# Patient Record
Sex: Male | Born: 1940
Health system: Southern US, Community
[De-identification: ages and names within clinical notes are randomized; demographics above are authoritative.]

## PROBLEM LIST (undated history)

## (undated) DIAGNOSIS — R519 Headache, unspecified: Secondary | ICD-10-CM

## (undated) DIAGNOSIS — I639 Cerebral infarction, unspecified: Secondary | ICD-10-CM

## (undated) DIAGNOSIS — M1990 Unspecified osteoarthritis, unspecified site: Secondary | ICD-10-CM

## (undated) DIAGNOSIS — J449 Chronic obstructive pulmonary disease, unspecified: Secondary | ICD-10-CM

## (undated) DIAGNOSIS — R51 Headache: Secondary | ICD-10-CM

## (undated) DIAGNOSIS — R251 Tremor, unspecified: Secondary | ICD-10-CM

## (undated) DIAGNOSIS — W3400XA Accidental discharge from unspecified firearms or gun, initial encounter: Secondary | ICD-10-CM

## (undated) DIAGNOSIS — N289 Disorder of kidney and ureter, unspecified: Secondary | ICD-10-CM

## (undated) HISTORY — PX: EYE SURGERY: SHX253

## (undated) HISTORY — PX: BRAIN SURGERY: SHX531

## (undated) HISTORY — PX: SHOULDER SURGERY: SHX246

## (undated) HISTORY — PX: BACK SURGERY: SHX140

## (undated) SURGERY — Surgical Case
Anesthesia: *Unknown

---

## 2015-07-14 ENCOUNTER — Emergency Department (HOSPITAL_BASED_OUTPATIENT_CLINIC_OR_DEPARTMENT_OTHER)
Admission: EM | Admit: 2015-07-14 | Discharge: 2015-07-14 | Disposition: A | Discharge status: Home / Self Care | Payer: Medicare Other | Attending: Emergency Medicine | Admitting: Emergency Medicine

## 2015-07-14 ENCOUNTER — Other Ambulatory Visit: Payer: Self-pay

## 2015-07-14 ENCOUNTER — Encounter (HOSPITAL_BASED_OUTPATIENT_CLINIC_OR_DEPARTMENT_OTHER): Payer: Self-pay | Admitting: *Deleted

## 2015-07-14 ENCOUNTER — Emergency Department (HOSPITAL_BASED_OUTPATIENT_CLINIC_OR_DEPARTMENT_OTHER): Payer: Medicare Other

## 2015-07-14 DIAGNOSIS — J441 Chronic obstructive pulmonary disease with (acute) exacerbation: Secondary | ICD-10-CM | POA: Insufficient documentation

## 2015-07-14 DIAGNOSIS — Z87828 Personal history of other (healed) physical injury and trauma: Secondary | ICD-10-CM | POA: Insufficient documentation

## 2015-07-14 DIAGNOSIS — R911 Solitary pulmonary nodule: Secondary | ICD-10-CM | POA: Diagnosis not present

## 2015-07-14 DIAGNOSIS — Z79899 Other long term (current) drug therapy: Secondary | ICD-10-CM | POA: Diagnosis not present

## 2015-07-14 DIAGNOSIS — M6281 Muscle weakness (generalized): Secondary | ICD-10-CM | POA: Diagnosis not present

## 2015-07-14 DIAGNOSIS — Z87448 Personal history of other diseases of urinary system: Secondary | ICD-10-CM | POA: Insufficient documentation

## 2015-07-14 DIAGNOSIS — R0602 Shortness of breath: Secondary | ICD-10-CM | POA: Diagnosis present

## 2015-07-14 DIAGNOSIS — F17210 Nicotine dependence, cigarettes, uncomplicated: Secondary | ICD-10-CM | POA: Insufficient documentation

## 2015-07-14 HISTORY — DX: Disorder of kidney and ureter, unspecified: N28.9

## 2015-07-14 HISTORY — DX: Chronic obstructive pulmonary disease, unspecified: J44.9

## 2015-07-14 HISTORY — DX: Accidental discharge from unspecified firearms or gun, initial encounter: W34.00XA

## 2015-07-14 LAB — CBC
HCT: 46.7 % (ref 39.0–52.0)
HEMOGLOBIN: 16 g/dL (ref 13.0–17.0)
MCH: 31.6 pg (ref 26.0–34.0)
MCHC: 34.3 g/dL (ref 30.0–36.0)
MCV: 92.3 fL (ref 78.0–100.0)
Platelets: 213 10*3/uL (ref 150–400)
RBC: 5.06 MIL/uL (ref 4.22–5.81)
RDW: 13 % (ref 11.5–15.5)
WBC: 4.8 10*3/uL (ref 4.0–10.5)

## 2015-07-14 LAB — BASIC METABOLIC PANEL
ANION GAP: 7 (ref 5–15)
BUN: 12 mg/dL (ref 6–20)
CALCIUM: 8.9 mg/dL (ref 8.9–10.3)
CHLORIDE: 104 mmol/L (ref 101–111)
CO2: 26 mmol/L (ref 22–32)
Creatinine, Ser: 1.07 mg/dL (ref 0.61–1.24)
GFR calc non Af Amer: >60 mL/min (ref >60)
Glucose, Bld: 99 mg/dL (ref 65–99)
Potassium: 4.2 mmol/L (ref 3.5–5.1)
Sodium: 137 mmol/L (ref 135–145)

## 2015-07-14 LAB — TROPONIN I: Troponin I: <0.03 ng/mL (ref <0.031)

## 2015-07-14 LAB — D-DIMER, QUANTITATIVE: D-Dimer, Quant: 0.95 ug/mL-FEU — ABNORMAL HIGH (ref 0.00–0.50)

## 2015-07-14 MED ORDER — ALBUTEROL SULFATE HFA 108 (90 BASE) MCG/ACT IN AERS: 2.0000 | INHALATION_SPRAY | Freq: Four times a day (QID) | RESPIRATORY_TRACT | Status: DC | PRN

## 2015-07-14 MED ORDER — MAGNESIUM SULFATE 2 GM/50ML IV SOLN
2.0000 g | Freq: Once | INTRAVENOUS | Status: AC
  Administered 2015-07-14: 2 g via INTRAVENOUS
  Filled 2015-07-14: qty 50

## 2015-07-14 MED ORDER — AZITHROMYCIN 250 MG PO TABS: 250.0000 mg | ORAL_TABLET | Freq: Every day | ORAL | Status: DC

## 2015-07-14 MED ORDER — IOHEXOL 350 MG/ML SOLN
100.0000 mL | Freq: Once | INTRAVENOUS | Status: AC | PRN
  Administered 2015-07-14: 100 mL via INTRAVENOUS

## 2015-07-14 MED ORDER — METHYLPREDNISOLONE SODIUM SUCC 125 MG IJ SOLR
125.0000 mg | Freq: Once | INTRAMUSCULAR | Status: AC
  Administered 2015-07-14: 125 mg via INTRAVENOUS
  Filled 2015-07-14: qty 2

## 2015-07-14 MED ORDER — SODIUM CHLORIDE 0.9 % IV BOLUS (SEPSIS)
1000.0000 mL | Freq: Once | INTRAVENOUS | Status: AC
  Administered 2015-07-14: 1000 mL via INTRAVENOUS

## 2015-07-14 MED ORDER — ALBUTEROL SULFATE HFA 108 (90 BASE) MCG/ACT IN AERS
2.0000 | INHALATION_SPRAY | Freq: Once | RESPIRATORY_TRACT | Status: AC
  Administered 2015-07-14: 2 via RESPIRATORY_TRACT
  Filled 2015-07-14: qty 6.7

## 2015-07-14 MED ORDER — OXYCODONE HCL 5 MG PO TABS: 5.0000 mg | ORAL_TABLET | ORAL | Status: DC | PRN

## 2015-07-14 MED ORDER — ALBUTEROL (5 MG/ML) CONTINUOUS INHALATION SOLN
10.0000 mg/h | INHALATION_SOLUTION | Freq: Once | RESPIRATORY_TRACT | Status: AC
  Administered 2015-07-14: 10 mg/h via RESPIRATORY_TRACT
  Filled 2015-07-14: qty 20

## 2015-07-14 MED ORDER — MORPHINE SULFATE (PF) 4 MG/ML IV SOLN
4.0000 mg | Freq: Once | INTRAVENOUS | Status: AC
  Administered 2015-07-14: 4 mg via INTRAVENOUS
  Filled 2015-07-14: qty 1

## 2015-07-14 MED ORDER — IPRATROPIUM BROMIDE 0.02 % IN SOLN
0.5000 mg | Freq: Once | RESPIRATORY_TRACT | Status: AC
  Administered 2015-07-14: 0.5 mg via RESPIRATORY_TRACT
  Filled 2015-07-14: qty 2.5

## 2015-07-14 MED ORDER — PREDNISONE 20 MG PO TABS: ORAL_TABLET | ORAL | Status: DC

## 2015-07-14 MED FILL — PROAIR HFA 90 MCG INHALER: 108 (90 BAS | 30 days supply | Qty: 9 | Fill #0

## 2015-07-14 MED FILL — oxyCODONE HCL 5 MG TABS: 5 | 2 days supply | Qty: 10 | Fill #0

## 2015-07-14 MED FILL — predniSONE 20 MG TABS: 20 | 5 days supply | Qty: 10 | Fill #0

## 2015-07-14 MED FILL — AZITHROMYCIN 250 MG TABLET: 250 | 4 days supply | Qty: 4 | Fill #0

## 2015-07-14 NOTE — Discharge Instructions (Signed)
CT FINDINGS: 4 mm nodule seen in right upper lobe. No follow-up needed if patient is low-risk. Non-contrast chest CT can be considered in 12 months if patient is high-risk. This recommendation follows the consensus statement: Guidelines for Management of Incidental Pulmonary Nodules Detected on CT Images:From the Fleischner Society 2017; published online before print (10.1148/[id number]).  - You need to follow up with your doctor to schedule  CT scan because your smoking history makes you high risk
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
## 2015-07-14 NOTE — ED Notes (Signed)
Cough x 2 days. Sob. He is speaking in complete sentences. Weakness in his legs. States he had back surgery 4 months ago.
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
## 2015-07-20 ENCOUNTER — Encounter (HOSPITAL_BASED_OUTPATIENT_CLINIC_OR_DEPARTMENT_OTHER): Payer: Self-pay | Admitting: *Deleted

## 2015-07-20 ENCOUNTER — Emergency Department (HOSPITAL_BASED_OUTPATIENT_CLINIC_OR_DEPARTMENT_OTHER)
Admission: EM | Admit: 2015-07-20 | Discharge: 2015-07-20 | Disposition: A | Discharge status: Home / Self Care | Payer: Medicare Other | Attending: Emergency Medicine | Admitting: Emergency Medicine

## 2015-07-20 DIAGNOSIS — Z87891 Personal history of nicotine dependence: Secondary | ICD-10-CM | POA: Diagnosis not present

## 2015-07-20 DIAGNOSIS — Z79899 Other long term (current) drug therapy: Secondary | ICD-10-CM | POA: Insufficient documentation

## 2015-07-20 DIAGNOSIS — Z87448 Personal history of other diseases of urinary system: Secondary | ICD-10-CM | POA: Diagnosis not present

## 2015-07-20 DIAGNOSIS — Z87828 Personal history of other (healed) physical injury and trauma: Secondary | ICD-10-CM | POA: Diagnosis not present

## 2015-07-20 DIAGNOSIS — J441 Chronic obstructive pulmonary disease with (acute) exacerbation: Secondary | ICD-10-CM | POA: Insufficient documentation

## 2015-07-20 DIAGNOSIS — R0602 Shortness of breath: Secondary | ICD-10-CM | POA: Diagnosis present

## 2015-07-20 DIAGNOSIS — Z792 Long term (current) use of antibiotics: Secondary | ICD-10-CM | POA: Diagnosis not present

## 2015-07-20 HISTORY — DX: Tremor, unspecified: R25.1

## 2015-07-20 MED ORDER — ALBUTEROL SULFATE (2.5 MG/3ML) 0.083% IN NEBU
5.0000 mg | INHALATION_SOLUTION | Freq: Once | RESPIRATORY_TRACT | Status: AC
  Administered 2015-07-20: 5 mg via RESPIRATORY_TRACT
  Filled 2015-07-20: qty 6

## 2015-07-20 MED ORDER — LEVOFLOXACIN 750 MG PO TABS
750.0000 mg | ORAL_TABLET | Freq: Once | ORAL | Status: AC
  Administered 2015-07-20: 750 mg via ORAL
  Filled 2015-07-20: qty 1

## 2015-07-20 MED ORDER — IPRATROPIUM BROMIDE 0.02 % IN SOLN
0.5000 mg | Freq: Once | RESPIRATORY_TRACT | Status: AC
  Administered 2015-07-20: 0.5 mg via RESPIRATORY_TRACT
  Filled 2015-07-20: qty 2.5

## 2015-07-20 MED ORDER — LEVOFLOXACIN 750 MG PO TABS: 750.0000 mg | ORAL_TABLET | Freq: Every day | ORAL | Status: DC

## 2015-07-20 NOTE — ED Notes (Signed)
Pt here with c/o of SOB. Reports his cat chewed a hole in the tubing of his nebulizer and he was unable to take his breathing treatment last night. Recent antibiotics. RT in triage to evaluate. Hx of COPD and reports pain with coughing
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
## 2015-07-20 NOTE — ED Provider Notes (Signed)
Patient with worsening cough and shortness of breath since he ran out of his antibiotic 2 days ago. Was formally prescribed an antibiotic for COPD exacerbation. He also had a hole punched in the tubing of his home nebulizer. He is breathing normally since treatment here. He does report urinary frequency and urinating small frequent amounts for the past 2 weeks. He declined to give a urine sample here stating he will go to his doctor tomorrow to produce a urine sample. He had been met with normal renal function and glucose on 07/14/2015. Patient is no distress lungs clear auscultation abdomen nondistended nontender extremities without edema heart regular rate and rhythm  Orlie Dakin, MD 07/20/15 1715

## 2015-07-20 NOTE — Discharge Instructions (Signed)
Please read and follow all provided instructions.  Your diagnoses today include:  1. COPD with exacerbation (HCC)    Tests performed today include:  Vital signs. See below for your results today.   Medications prescribed:   Levaquin - antibiotic for respiratory infection  You have been prescribed an antibiotic medicine: take the entire course of medicine even if you are feeling better. Stopping early can cause the antibiotic not to work.  Take any prescribed medications only as directed.  Home care instructions:  Follow any educational materials contained in this packet.  Continue to avoid cigarettes.   Follow-up instructions: Please follow-up with your primary care provider in the next 3 days for further evaluation of your symptoms and management of your COPD and also for a check of your urine.   Return instructions:   Please return to the Emergency Department if you experience worsening symptoms.  Please return with worsening wheezing, shortness of breath, or difficulty breathing.  Return with persistent fever above 101F.   Please return if you have any other emergent concerns.  Additional Information:  Your vital signs today were: BP 109/81 mmHg   Pulse 72   Temp(Src) 98.6 F (37 C) (Oral)   Resp 20   Ht 5\' 11"  (1.803 m)   Wt 82.101 kg   BMI 25.26 kg/m2   SpO2 97% If your blood pressure (BP) was elevated above 135/85 this visit, please have this repeated by your doctor within one month. --------------

## 2015-10-20 ENCOUNTER — Encounter (HOSPITAL_BASED_OUTPATIENT_CLINIC_OR_DEPARTMENT_OTHER): Payer: Self-pay | Admitting: Emergency Medicine

## 2015-10-20 ENCOUNTER — Emergency Department (HOSPITAL_BASED_OUTPATIENT_CLINIC_OR_DEPARTMENT_OTHER)
Admission: EM | Admit: 2015-10-20 | Discharge: 2015-10-20 | Disposition: A | Discharge status: Home / Self Care | Payer: Medicare Other | Attending: Emergency Medicine | Admitting: Emergency Medicine

## 2015-10-20 ENCOUNTER — Emergency Department (HOSPITAL_BASED_OUTPATIENT_CLINIC_OR_DEPARTMENT_OTHER): Payer: Medicare Other

## 2015-10-20 DIAGNOSIS — M1712 Unilateral primary osteoarthritis, left knee: Secondary | ICD-10-CM | POA: Diagnosis not present

## 2015-10-20 DIAGNOSIS — J449 Chronic obstructive pulmonary disease, unspecified: Secondary | ICD-10-CM | POA: Insufficient documentation

## 2015-10-20 DIAGNOSIS — M25562 Pain in left knee: Secondary | ICD-10-CM | POA: Diagnosis present

## 2015-10-20 DIAGNOSIS — Z87891 Personal history of nicotine dependence: Secondary | ICD-10-CM | POA: Insufficient documentation

## 2015-10-20 MED ORDER — PREDNISONE 10 MG PO TABS: 20.0000 mg | ORAL_TABLET | Freq: Two times a day (BID) | ORAL | Status: DC

## 2015-10-20 MED ORDER — HYDROCODONE-ACETAMINOPHEN 5-325 MG PO TABS
2.0000 | ORAL_TABLET | Freq: Once | ORAL | Status: AC
  Administered 2015-10-20: 2 via ORAL
  Filled 2015-10-20: qty 2

## 2015-10-20 MED ORDER — HYDROCODONE-ACETAMINOPHEN 5-325 MG PO TABS: 1.0000 | ORAL_TABLET | Freq: Four times a day (QID) | ORAL | Status: DC | PRN

## 2015-10-20 MED FILL — HYDROCODON-APAP 5-325: 5-325 | 2 days supply | Qty: 20 | Fill #0

## 2015-10-20 MED FILL — predniSONE 10 MG TABS: 10 | 5 days supply | Qty: 20 | Fill #0

## 2015-10-20 NOTE — ED Notes (Signed)
MD at bedside. 

## 2015-10-20 NOTE — ED Notes (Signed)
Left knee pain for three weeks.  No known injury.

## 2015-10-20 NOTE — Discharge Instructions (Signed)
Prednisone as prescribed. Hydrocodone as prescribed as needed for pain.  Follow-up with your orthopedist if you are not improving in the next week, and return to the ER if your symptoms significantly worsen or change in the meantime.   Osteoarthritis Osteoarthritis is a disease that causes soreness and inflammation of a joint. It occurs when the cartilage at the affected joint wears down. Cartilage acts as a cushion, covering the ends of bones where they meet to form a joint. Osteoarthritis is the most common form of arthritis. It often occurs in older people. The joints affected most often by this condition include those in the:  Ends of the fingers.  Thumbs.  Neck.  Lower back.  Knees.  Hips. CAUSES  Over time, the cartilage that covers the ends of bones begins to wear away. This causes bone to rub on bone, producing pain and stiffness in the affected joints.  RISK FACTORS Certain factors can increase your chances of having osteoarthritis, including:  Older age.  Excessive body weight.  Overuse of joints.  Previous joint injury. SIGNS AND SYMPTOMS   Pain, swelling, and stiffness in the joint.  Over time, the joint may lose its normal shape.  Small deposits of bone (osteophytes) may grow on the edges of the joint.  Bits of bone or cartilage can break off and float inside the joint space. This may cause more pain and damage. DIAGNOSIS  Your health care provider will do a physical exam and ask about your symptoms. Various tests may be ordered, such as:  X-rays of the affected joint.  Blood tests to rule out other types of arthritis. Additional tests may be used to diagnose your condition. TREATMENT  Goals of treatment are to control pain and improve joint function. Treatment plans may include:  A prescribed exercise program that allows for rest and joint relief.  A weight control plan.  Pain relief techniques, such as:  Properly applied heat and  cold.  Electric pulses delivered to nerve endings under the skin (transcutaneous electrical nerve stimulation [TENS]).  Massage.  Certain nutritional supplements.  Medicines to control pain, such as:  Acetaminophen.  Nonsteroidal anti-inflammatory drugs (NSAIDs), such as naproxen.  Narcotic or central-acting agents, such as tramadol.  Corticosteroids. These can be given orally or as an injection.  Surgery to reposition the bones and relieve pain (osteotomy) or to remove loose pieces of bone and cartilage. Joint replacement may be needed in advanced states of osteoarthritis. HOME CARE INSTRUCTIONS   Take medicines only as directed by your health care provider.  Maintain a healthy weight. Follow your health care provider's instructions for weight control. This may include dietary instructions.  Exercise as directed. Your health care provider can recommend specific types of exercise. These may include:  Strengthening exercises. These are done to strengthen the muscles that support joints affected by arthritis. They can be performed with weights or with exercise bands to add resistance.  Aerobic activities. These are exercises, such as brisk walking or low-impact aerobics, that get your heart pumping.  Range-of-motion activities. These keep your joints limber.  Balance and agility exercises. These help you maintain daily living skills.  Rest your affected joints as directed by your health care provider.  Keep all follow-up visits as directed by your health care provider. SEEK MEDICAL CARE IF:   Your skin turns red.  You develop a rash in addition to your joint pain.  You have worsening joint pain.  You have a fever along with joint or  muscle aches. SEEK IMMEDIATE MEDICAL CARE IF:  You have a significant loss of weight or appetite.  You have night sweats. FOR MORE INFORMATION   National Institute of Arthritis and Musculoskeletal and Skin Diseases:  www.niams.http://www.myers.net/nih.gov  [employer]: https://walker.com/www.nia.nih.gov  American College of Rheumatology: www.rheumatology.org   This information is not intended to replace advice given to you by your health care provider. Make sure you discuss any questions you have with your health care provider.   Document Released: 04/05/2005 Document Revised: 04/26/2014 Document Reviewed: 12/11/2012 Elsevier Interactive Patient Education [employer]2016 Elsevier Inc.

## 2015-10-20 NOTE — ED Notes (Signed)
Patient transported to X-ray via wheelchair per tech. 
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
## 2015-10-20 NOTE — ED Notes (Signed)
MD at bedside to discuss results of testing. 

## 2015-10-20 NOTE — ED Notes (Signed)
Patient transported to ultrasound via stretcher per tech. 

## 2015-10-25 ENCOUNTER — Emergency Department (HOSPITAL_BASED_OUTPATIENT_CLINIC_OR_DEPARTMENT_OTHER): Payer: Medicare Other

## 2015-10-25 ENCOUNTER — Encounter (HOSPITAL_BASED_OUTPATIENT_CLINIC_OR_DEPARTMENT_OTHER): Payer: Self-pay | Admitting: Emergency Medicine

## 2015-10-25 ENCOUNTER — Emergency Department (HOSPITAL_BASED_OUTPATIENT_CLINIC_OR_DEPARTMENT_OTHER)
Admission: EM | Admit: 2015-10-25 | Discharge: 2015-10-25 | Disposition: A | Discharge status: Home / Self Care | Payer: Medicare Other | Attending: Emergency Medicine | Admitting: Emergency Medicine

## 2015-10-25 DIAGNOSIS — J449 Chronic obstructive pulmonary disease, unspecified: Secondary | ICD-10-CM | POA: Diagnosis not present

## 2015-10-25 DIAGNOSIS — M76892 Other specified enthesopathies of left lower limb, excluding foot: Secondary | ICD-10-CM | POA: Insufficient documentation

## 2015-10-25 DIAGNOSIS — Z87891 Personal history of nicotine dependence: Secondary | ICD-10-CM | POA: Insufficient documentation

## 2015-10-25 DIAGNOSIS — M25562 Pain in left knee: Secondary | ICD-10-CM | POA: Diagnosis present

## 2015-10-25 DIAGNOSIS — M6780 Other specified disorders of synovium and tendon, unspecified site: Secondary | ICD-10-CM

## 2015-10-25 MED ORDER — KETOROLAC TROMETHAMINE 60 MG/2ML IM SOLN
60.0000 mg | Freq: Once | INTRAMUSCULAR | Status: AC
  Administered 2015-10-25: 60 mg via INTRAMUSCULAR
  Filled 2015-10-25: qty 2

## 2015-10-25 NOTE — ED Notes (Signed)
Pt seen here earlier this week and dx with OA with L knee. Pt states he has not been able to get an appt with his orthopedist. Pt states he stepped on a curb last night and had significant increase in pain to L knee. Pt states pain med rx is not working.
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
## 2015-11-17 ENCOUNTER — Emergency Department (HOSPITAL_BASED_OUTPATIENT_CLINIC_OR_DEPARTMENT_OTHER)
Admission: EM | Admit: 2015-11-17 | Discharge: 2015-11-17 | Disposition: A | Discharge status: Home / Self Care | Payer: Medicare Other | Attending: Emergency Medicine | Admitting: Emergency Medicine

## 2015-11-17 ENCOUNTER — Encounter (HOSPITAL_BASED_OUTPATIENT_CLINIC_OR_DEPARTMENT_OTHER): Payer: Self-pay | Admitting: Emergency Medicine

## 2015-11-17 DIAGNOSIS — M25562 Pain in left knee: Secondary | ICD-10-CM | POA: Diagnosis not present

## 2015-11-17 DIAGNOSIS — G8929 Other chronic pain: Secondary | ICD-10-CM | POA: Diagnosis not present

## 2015-11-17 DIAGNOSIS — Z87891 Personal history of nicotine dependence: Secondary | ICD-10-CM | POA: Diagnosis not present

## 2015-11-17 DIAGNOSIS — Z7951 Long term (current) use of inhaled steroids: Secondary | ICD-10-CM | POA: Insufficient documentation

## 2015-11-17 DIAGNOSIS — J449 Chronic obstructive pulmonary disease, unspecified: Secondary | ICD-10-CM | POA: Insufficient documentation

## 2015-11-17 HISTORY — DX: Unspecified osteoarthritis, unspecified site: M19.90

## 2015-11-17 MED ORDER — HYDROCODONE-ACETAMINOPHEN 5-325 MG PO TABS
2.0000 | ORAL_TABLET | Freq: Once | ORAL | Status: AC
  Administered 2015-11-17: 2 via ORAL
  Filled 2015-11-17: qty 2

## 2015-11-17 MED ORDER — PREDNISONE 50 MG PO TABS: 50.0000 mg | ORAL_TABLET | Freq: Every day | ORAL | 0 refills | Status: DC

## 2015-11-17 MED ORDER — HYDROCODONE-ACETAMINOPHEN 5-325 MG PO TABS: 1.0000 | ORAL_TABLET | Freq: Four times a day (QID) | ORAL | 0 refills | Status: DC | PRN

## 2015-11-17 MED FILL — HYDROCODON-APAP 5-325: 5-325 | 2 days supply | Qty: 10 | Fill #0

## 2015-11-17 MED FILL — predniSONE 50 MG TABS: 50 | 5 days supply | Qty: 5 | Fill #0

## 2015-11-17 NOTE — Discharge Instructions (Signed)
Take motrin for pain,  Take vicodin for severe pain.   Use your cane to help you walk.   See Dr. Elita Quick as scheduled.   Return to ER if you have trouble walking, severe pain, worse knee swelling.
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
## 2015-11-17 NOTE — ED Notes (Signed)
Son at bedside to drive patient home.
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
## 2016-01-12 ENCOUNTER — Encounter (HOSPITAL_BASED_OUTPATIENT_CLINIC_OR_DEPARTMENT_OTHER): Payer: Self-pay | Admitting: *Deleted

## 2016-01-12 ENCOUNTER — Emergency Department (HOSPITAL_BASED_OUTPATIENT_CLINIC_OR_DEPARTMENT_OTHER): Payer: Medicare Other

## 2016-01-12 ENCOUNTER — Emergency Department (HOSPITAL_BASED_OUTPATIENT_CLINIC_OR_DEPARTMENT_OTHER)
Admission: EM | Admit: 2016-01-12 | Discharge: 2016-01-12 | Disposition: A | Discharge status: Home / Self Care | Payer: Medicare Other | Attending: Emergency Medicine | Admitting: Emergency Medicine

## 2016-01-12 DIAGNOSIS — J449 Chronic obstructive pulmonary disease, unspecified: Secondary | ICD-10-CM | POA: Diagnosis not present

## 2016-01-12 DIAGNOSIS — B349 Viral infection, unspecified: Secondary | ICD-10-CM | POA: Diagnosis not present

## 2016-01-12 DIAGNOSIS — Z87891 Personal history of nicotine dependence: Secondary | ICD-10-CM | POA: Insufficient documentation

## 2016-01-12 DIAGNOSIS — M545 Low back pain: Secondary | ICD-10-CM | POA: Diagnosis present

## 2016-01-12 LAB — CBC WITH DIFFERENTIAL/PLATELET
BASOS PCT: 1 %
Basophils Absolute: 0 10*3/uL (ref 0.0–0.1)
EOS ABS: 0 10*3/uL (ref 0.0–0.7)
EOS PCT: 1 %
HCT: 43.8 % (ref 39.0–52.0)
HEMOGLOBIN: 15.6 g/dL (ref 13.0–17.0)
Lymphocytes Relative: 16 %
Lymphs Abs: 1 10*3/uL (ref 0.7–4.0)
MCH: 31.2 pg (ref 26.0–34.0)
MCHC: 35.6 g/dL (ref 30.0–36.0)
MCV: 87.6 fL (ref 78.0–100.0)
Monocytes Absolute: 0.8 10*3/uL (ref 0.1–1.0)
Monocytes Relative: 13 %
NEUTROS PCT: 69 %
Neutro Abs: 4.3 10*3/uL (ref 1.7–7.7)
PLATELETS: 209 10*3/uL (ref 150–400)
RBC: 5 MIL/uL (ref 4.22–5.81)
RDW: 12.7 % (ref 11.5–15.5)
WBC: 6.1 10*3/uL (ref 4.0–10.5)

## 2016-01-12 LAB — COMPREHENSIVE METABOLIC PANEL
ALBUMIN: 3.7 g/dL (ref 3.5–5.0)
ALK PHOS: 66 U/L (ref 38–126)
ALT: 34 U/L (ref 17–63)
ANION GAP: 10 (ref 5–15)
AST: 43 U/L — ABNORMAL HIGH (ref 15–41)
BUN: 19 mg/dL (ref 6–20)
CALCIUM: 8.9 mg/dL (ref 8.9–10.3)
CHLORIDE: 96 mmol/L — AB (ref 101–111)
CO2: 24 mmol/L (ref 22–32)
CREATININE: 1.03 mg/dL (ref 0.61–1.24)
GFR calc Af Amer: >60 mL/min (ref >60)
GFR calc non Af Amer: >60 mL/min (ref >60)
GLUCOSE: 95 mg/dL (ref 65–99)
Potassium: 4 mmol/L (ref 3.5–5.1)
SODIUM: 130 mmol/L — AB (ref 135–145)
Total Bilirubin: 0.7 mg/dL (ref 0.3–1.2)
Total Protein: 6.9 g/dL (ref 6.5–8.1)

## 2016-01-12 LAB — URINALYSIS, ROUTINE W REFLEX MICROSCOPIC
GLUCOSE, UA: NEGATIVE mg/dL
Hgb urine dipstick: NEGATIVE
Ketones, ur: 15 mg/dL — AB
LEUKOCYTES UA: NEGATIVE
Nitrite: NEGATIVE
PH: 5.5 (ref 5.0–8.0)
Protein, ur: 30 mg/dL — AB
Specific Gravity, Urine: 1.024 (ref 1.005–1.030)

## 2016-01-12 LAB — URINE MICROSCOPIC-ADD ON

## 2016-01-12 LAB — MAGNESIUM: Magnesium: 2.1 mg/dL (ref 1.7–2.4)

## 2016-01-12 MED ORDER — FENTANYL CITRATE (PF) 100 MCG/2ML IJ SOLN
50.0000 ug | Freq: Once | INTRAMUSCULAR | Status: AC
  Administered 2016-01-12: 50 ug via INTRAVENOUS
  Filled 2016-01-12: qty 2

## 2016-01-12 MED ORDER — SODIUM CHLORIDE 0.9 % IV BOLUS (SEPSIS)
1000.0000 mL | Freq: Once | INTRAVENOUS | Status: AC
  Administered 2016-01-12: 1000 mL via INTRAVENOUS

## 2016-01-12 NOTE — ED Triage Notes (Signed)
Bilateral lower back pain-worse with movement x 2 weeks.  Denies dysuria.  Pt reports that he thinks he had urinary retention and that his urine is dark.  Reports fever of 102 yesterday.
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
## 2016-11-08 ENCOUNTER — Other Ambulatory Visit (HOSPITAL_COMMUNITY): Payer: Self-pay

## 2016-11-08 NOTE — Progress Notes (Signed)
Message left with Winn Jockathy Blum from Dr. Wadie Lessenowan's office for surgery orders.

## 2016-11-08 NOTE — Pre-Procedure Instructions (Signed)
Jodey Dalto  11/08/2016      [facility] [id number] - [place], [place] - [address] AT [facility] [address] [place] [id number] Phone: [phone] Fax: [phone]    Your procedure is scheduled on November 22, 2016  [facility] "A" at 5:30 A.M.  Call this number if you have problems the morning of surgery:  [phone]   Remember:  Do not eat food or drink liquids after midnight on November 23, 2016  Take these medicines the morning of surgery with A SIP OF WATER: If needed HYDROcodone-acetaminophen (NORCO/VICODIN).  7 days before surgery stop taking all Aspirins, Vitamins, Fish oils, and Herbal medications. Also all NSAIDS i.e. Advil, Motrin, Aleve, Anaprox, Naproxen, BC and Goody Powders.   Do not wear jewelry.  Do not wear lotions, powders, or perfumes, or deoderant.  Do not shave 48 hours prior to surgery.  Men may shave face and neck.  Do not bring valuables to the hospital.  [facility] is not responsible for any belongings or valuables.  Contacts, dentures or bridgework may not be worn into surgery.  Leave your suitcase in the car.  After surgery it may be brought to your room.  For patients admitted to the hospital, discharge time will be determined by your treatment team.   Special instructions:    [facility]- Preparing For Surgery  Before surgery, you can play an important role. Because skin is not sterile, your skin needs to be as free of germs as possible. You can reduce the number of germs on your skin by washing with CHG (chlorahexidine gluconate) Soap before surgery.  CHG is an antiseptic cleaner which kills germs and bonds with the skin to continue killing germs even after washing.  Please do not use if you have an allergy to CHG or antibacterial soaps. If your skin becomes reddened/irritated stop using the CHG.  Do not shave (including legs and underarms) for at least 48 hours  prior to first CHG shower. It is OK to shave your face.  Please follow these instructions carefully.   1. Shower the NIGHT BEFORE SURGERY and the MORNING OF SURGERY with CHG.   2. If you chose to wash your hair, wash your hair first as usual with your normal shampoo.  3. After you shampoo, rinse your hair and body thoroughly to remove the shampoo.  4. Use CHG as you would any other liquid soap. You can apply CHG directly to the skin and wash gently with a scrungie or a clean washcloth.   5. Apply the CHG Soap to your body ONLY FROM THE NECK DOWN.  Do not use on open wounds or open sores. Avoid contact with your eyes, ears, mouth and genitals (private parts). Wash genitals (private parts) with your normal soap.  6. Wash thoroughly, paying special attention to the area where your surgery will be performed.  7. Thoroughly rinse your body with warm water from the neck down.  8. DO NOT shower/wash with your normal soap after using and rinsing off the CHG Soap.  9. Pat yourself dry with a CLEAN TOWEL.   10. Wear CLEAN PAJAMAS   11. Place CLEAN SHEETS on your bed the night of your first shower and DO NOT SLEEP WITH PETS.  Day of Surgery: Do not apply any deodorants/lotions. Please wear clean clothes to the hospital/surgery center.    Please read over the  following fact sheets that you were given. Pain Booklet, Coughing and Deep Breathing, MRSA Information and Surgical Site Infection Prevention

## 2016-11-09 ENCOUNTER — Other Ambulatory Visit (HOSPITAL_COMMUNITY): Payer: Self-pay | Admitting: Orthopedic Surgery

## 2016-11-09 ENCOUNTER — Encounter (HOSPITAL_COMMUNITY): Payer: Self-pay

## 2016-11-09 ENCOUNTER — Encounter (HOSPITAL_COMMUNITY)
Admission: RE | Admit: 2016-11-09 | Discharge: 2016-11-09 | Disposition: A | Discharge status: Home / Self Care | Payer: Medicare Other | Source: Ambulatory Visit | Attending: Orthopedic Surgery | Admitting: Orthopedic Surgery

## 2016-11-09 DIAGNOSIS — J449 Chronic obstructive pulmonary disease, unspecified: Secondary | ICD-10-CM | POA: Diagnosis not present

## 2016-11-09 DIAGNOSIS — Z87891 Personal history of nicotine dependence: Secondary | ICD-10-CM | POA: Insufficient documentation

## 2016-11-09 DIAGNOSIS — M1712 Unilateral primary osteoarthritis, left knee: Secondary | ICD-10-CM | POA: Diagnosis not present

## 2016-11-09 DIAGNOSIS — Z01818 Encounter for other preprocedural examination: Secondary | ICD-10-CM | POA: Insufficient documentation

## 2016-11-09 DIAGNOSIS — Z0183 Encounter for blood typing: Secondary | ICD-10-CM | POA: Insufficient documentation

## 2016-11-09 DIAGNOSIS — R251 Tremor, unspecified: Secondary | ICD-10-CM | POA: Insufficient documentation

## 2016-11-09 DIAGNOSIS — Z8673 Personal history of transient ischemic attack (TIA), and cerebral infarction without residual deficits: Secondary | ICD-10-CM | POA: Diagnosis not present

## 2016-11-09 DIAGNOSIS — Z01812 Encounter for preprocedural laboratory examination: Secondary | ICD-10-CM | POA: Insufficient documentation

## 2016-11-09 HISTORY — DX: Headache: R51

## 2016-11-09 HISTORY — DX: Cerebral infarction, unspecified: I63.9

## 2016-11-09 HISTORY — DX: Headache, unspecified: R51.9

## 2016-11-09 LAB — URINALYSIS, ROUTINE W REFLEX MICROSCOPIC
Bilirubin Urine: NEGATIVE
Glucose, UA: NEGATIVE mg/dL
Ketones, ur: NEGATIVE mg/dL
Nitrite: NEGATIVE
Protein, ur: NEGATIVE mg/dL
SPECIFIC GRAVITY, URINE: 1.01 (ref 1.005–1.030)
pH: 7 (ref 5.0–8.0)

## 2016-11-09 LAB — PROTIME-INR
INR: 1
Prothrombin Time: 13.2 seconds (ref 11.4–15.2)

## 2016-11-09 LAB — TYPE AND SCREEN
ABO/RH(D): O POS
Antibody Screen: NEGATIVE

## 2016-11-09 LAB — SURGICAL PCR SCREEN
MRSA, PCR: NEGATIVE
Staphylococcus aureus: NEGATIVE

## 2016-11-09 LAB — CBC WITH DIFFERENTIAL/PLATELET
Basophils Absolute: 0.1 10*3/uL (ref 0.0–0.1)
Basophils Relative: 1 %
Eosinophils Absolute: 0.2 10*3/uL (ref 0.0–0.7)
Eosinophils Relative: 2 %
HEMATOCRIT: 43.9 % (ref 39.0–52.0)
Hemoglobin: 15.6 g/dL (ref 13.0–17.0)
LYMPHS ABS: 2.5 10*3/uL (ref 0.7–4.0)
Lymphocytes Relative: 32 %
MCH: 31.7 pg (ref 26.0–34.0)
MCHC: 35.5 g/dL (ref 30.0–36.0)
MCV: 89.2 fL (ref 78.0–100.0)
MONOS PCT: 9 %
Monocytes Absolute: 0.7 10*3/uL (ref 0.1–1.0)
NEUTROS ABS: 4.4 10*3/uL (ref 1.7–7.7)
NEUTROS PCT: 56 %
Platelets: 277 10*3/uL (ref 150–400)
RBC: 4.92 MIL/uL (ref 4.22–5.81)
RDW: 12.5 % (ref 11.5–15.5)
WBC: 7.9 10*3/uL (ref 4.0–10.5)

## 2016-11-09 LAB — ABO/RH: ABO/RH(D): O POS

## 2016-11-09 LAB — APTT: aPTT: 33 seconds (ref 24–36)

## 2016-11-10 NOTE — Progress Notes (Signed)
Called Dr. Wadie Lessenowan's office and spoke with Raylene MiyamotoKathy Bloom this am notifying her that pt's urine was dirty. She states she will have Dr. Turner Danielsowan review it.
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
## 2016-11-19 ENCOUNTER — Encounter (HOSPITAL_COMMUNITY)
Admission: RE | Admit: 2016-11-19 | Discharge: 2016-11-19 | Disposition: A | Discharge status: Home / Self Care | Payer: Medicare Other | Source: Ambulatory Visit | Attending: Orthopedic Surgery | Admitting: Orthopedic Surgery

## 2016-11-19 DIAGNOSIS — M1712 Unilateral primary osteoarthritis, left knee: Secondary | ICD-10-CM | POA: Diagnosis present

## 2016-11-19 LAB — BASIC METABOLIC PANEL
Anion gap: 7 (ref 5–15)
BUN: 9 mg/dL (ref 6–20)
CALCIUM: 9.4 mg/dL (ref 8.9–10.3)
CHLORIDE: 104 mmol/L (ref 101–111)
CO2: 27 mmol/L (ref 22–32)
CREATININE: 1.09 mg/dL (ref 0.61–1.24)
GFR calc Af Amer: >60 mL/min (ref >60)
GFR calc non Af Amer: >60 mL/min (ref >60)
GLUCOSE: 94 mg/dL (ref 65–99)
Potassium: 3.8 mmol/L (ref 3.5–5.1)
Sodium: 138 mmol/L (ref 135–145)
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
## 2016-11-21 MED ORDER — BUPIVACAINE LIPOSOME 1.3 % IJ SUSP
20.0000 mL | Freq: Once | INTRAMUSCULAR | Status: DC
  Filled 2016-11-21: qty 20

## 2016-11-21 MED ORDER — TRANEXAMIC ACID 1000 MG/10ML IV SOLN
2000.0000 mg | INTRAVENOUS | Status: DC
  Filled 2016-11-21: qty 20

## 2016-11-21 NOTE — Anesthesia Preprocedure Evaluation (Signed)
Anesthesia Evaluation  Patient identified by MRN, date of birth, ID band Patient awake    Reviewed: Allergy & Precautions, H&P , Patient's Chart, lab work & pertinent test results, reviewed documented beta blocker date and time   Airway Mallampati: II  TM Distance: >3 FB Neck ROM: full    Dental no notable dental hx.    Pulmonary former smoker,    Pulmonary exam normal breath sounds clear to auscultation       Cardiovascular  Rhythm:regular Rate:Normal     Neuro/Psych    GI/Hepatic   Endo/Other    Renal/GU      Musculoskeletal   Abdominal   Peds  Hematology   Anesthesia Other Findings   Reproductive/Obstetrics                             Anesthesia Physical Anesthesia Plan  ASA: II  Anesthesia Plan: General   Post-op Pain Management:  Regional for Post-op pain   Induction: Intravenous  PONV Risk Score and Plan: 2 and Ondansetron and Dexamethasone  Airway Management Planned: LMA  Additional Equipment:   Intra-op Plan:   Post-operative Plan: Extubation in OR  Informed Consent: I have reviewed the patients History and Physical, chart, labs and discussed the procedure including the risks, benefits and alternatives for the proposed anesthesia with the patient or authorized representative who has indicated his/her understanding and acceptance.   Dental Advisory Given  Plan Discussed with: CRNA and Surgeon  Anesthesia Plan Comments: (  )        Anesthesia Quick Evaluation

## 2016-11-22 ENCOUNTER — Inpatient Hospital Stay (HOSPITAL_COMMUNITY): Payer: Medicare Other | Admitting: Vascular Surgery

## 2016-11-22 ENCOUNTER — Encounter (HOSPITAL_COMMUNITY): Payer: Self-pay | Admitting: *Deleted

## 2016-11-22 ENCOUNTER — Encounter (HOSPITAL_COMMUNITY)
Admission: RE | Disposition: A | Discharge status: Home Health | Payer: Self-pay | Source: Ambulatory Visit | Attending: Orthopedic Surgery

## 2016-11-22 ENCOUNTER — Inpatient Hospital Stay (HOSPITAL_COMMUNITY)
Admission: RE | Admit: 2016-11-22 | Discharge: 2016-11-25 | DRG: 470 | Disposition: A | Discharge status: Home Health | Payer: Medicare Other | Source: Ambulatory Visit | Attending: Orthopedic Surgery | Admitting: Orthopedic Surgery

## 2016-11-22 DIAGNOSIS — M21162 Varus deformity, not elsewhere classified, left knee: Secondary | ICD-10-CM | POA: Diagnosis present

## 2016-11-22 DIAGNOSIS — Z87891 Personal history of nicotine dependence: Secondary | ICD-10-CM | POA: Diagnosis not present

## 2016-11-22 DIAGNOSIS — Z8673 Personal history of transient ischemic attack (TIA), and cerebral infarction without residual deficits: Secondary | ICD-10-CM | POA: Diagnosis not present

## 2016-11-22 DIAGNOSIS — D62 Acute posthemorrhagic anemia: Secondary | ICD-10-CM | POA: Diagnosis not present

## 2016-11-22 DIAGNOSIS — M1712 Unilateral primary osteoarthritis, left knee: Secondary | ICD-10-CM | POA: Diagnosis present

## 2016-11-22 DIAGNOSIS — Z888 Allergy status to other drugs, medicaments and biological substances status: Secondary | ICD-10-CM | POA: Diagnosis not present

## 2016-11-22 DIAGNOSIS — Z791 Long term (current) use of non-steroidal anti-inflammatories (NSAID): Secondary | ICD-10-CM | POA: Diagnosis not present

## 2016-11-22 HISTORY — PX: TOTAL KNEE ARTHROPLASTY: SHX125

## 2016-11-22 SURGERY — ARTHROPLASTY, KNEE, TOTAL
Anesthesia: Regional | Site: Knee | Laterality: Left

## 2016-11-22 MED ORDER — TIZANIDINE HCL 2 MG PO TABS: 2.0000 mg | ORAL_TABLET | Freq: Four times a day (QID) | ORAL | 0 refills | Status: AC | PRN

## 2016-11-22 MED ORDER — FENTANYL CITRATE (PF) 100 MCG/2ML IJ SOLN: 25.0000 ug | INTRAMUSCULAR | Status: DC | PRN

## 2016-11-22 MED ORDER — ASPIRIN EC 325 MG PO TBEC: 325.0000 mg | DELAYED_RELEASE_TABLET | Freq: Two times a day (BID) | ORAL | 0 refills | Status: AC

## 2016-11-22 MED ORDER — BUPIVACAINE-EPINEPHRINE 0.25% -1:200000 IJ SOLN
INTRAMUSCULAR | Status: AC
  Filled 2016-11-22: qty 1

## 2016-11-22 MED ORDER — CEFAZOLIN SODIUM-DEXTROSE 2-4 GM/100ML-% IV SOLN
2.0000 g | INTRAVENOUS | Status: AC
  Administered 2016-11-22: 2 g via INTRAVENOUS
  Filled 2016-11-22: qty 100

## 2016-11-22 MED ORDER — BUPIVACAINE-EPINEPHRINE (PF) 0.25% -1:200000 IJ SOLN
INTRAMUSCULAR | Status: DC | PRN
  Administered 2016-11-22: 50 mL

## 2016-11-22 MED ORDER — CEFUROXIME SODIUM 1.5 G IV SOLR
INTRAVENOUS | Status: DC | PRN
  Administered 2016-11-22: 1.5 g via INTRAVENOUS

## 2016-11-22 MED ORDER — TRANEXAMIC ACID 1000 MG/10ML IV SOLN
1000.0000 mg | INTRAVENOUS | Status: AC
  Administered 2016-11-22: 1000 mg via INTRAVENOUS
  Filled 2016-11-22: qty 10

## 2016-11-22 MED ORDER — ALUM & MAG HYDROXIDE-SIMETH 200-200-20 MG/5ML PO SUSP: 30.0000 mL | ORAL | Status: DC | PRN

## 2016-11-22 MED ORDER — DEXAMETHASONE SODIUM PHOSPHATE 10 MG/ML IJ SOLN
INTRAMUSCULAR | Status: AC
  Filled 2016-11-22: qty 1

## 2016-11-22 MED ORDER — PROPOFOL 10 MG/ML IV BOLUS
INTRAVENOUS | Status: DC | PRN
  Administered 2016-11-22: 20 mg via INTRAVENOUS

## 2016-11-22 MED ORDER — ONDANSETRON HCL 4 MG/2ML IJ SOLN
INTRAMUSCULAR | Status: DC | PRN
  Administered 2016-11-22: 4 mg via INTRAVENOUS

## 2016-11-22 MED ORDER — LIDOCAINE 2% (20 MG/ML) 5 ML SYRINGE
INTRAMUSCULAR | Status: DC | PRN
  Administered 2016-11-22: 50 mg via INTRAVENOUS

## 2016-11-22 MED ORDER — FENTANYL CITRATE (PF) 250 MCG/5ML IJ SOLN
INTRAMUSCULAR | Status: AC
  Filled 2016-11-22: qty 5

## 2016-11-22 MED ORDER — BUPIVACAINE LIPOSOME 1.3 % IJ SUSP
INTRAMUSCULAR | Status: DC | PRN
  Administered 2016-11-22: 20 mL

## 2016-11-22 MED ORDER — DEXAMETHASONE SODIUM PHOSPHATE 10 MG/ML IJ SOLN
INTRAMUSCULAR | Status: DC | PRN
  Administered 2016-11-22: 8 mg via INTRAVENOUS

## 2016-11-22 MED ORDER — METHOCARBAMOL 500 MG PO TABS
500.0000 mg | ORAL_TABLET | Freq: Four times a day (QID) | ORAL | Status: DC | PRN
  Administered 2016-11-22 – 2016-11-25 (×8): 500 mg via ORAL
  Filled 2016-11-22 (×8): qty 1

## 2016-11-22 MED ORDER — ACETAMINOPHEN 325 MG PO TABS
650.0000 mg | ORAL_TABLET | Freq: Four times a day (QID) | ORAL | Status: DC | PRN
  Administered 2016-11-22 – 2016-11-24 (×2): 650 mg via ORAL
  Filled 2016-11-22 (×2): qty 2

## 2016-11-22 MED ORDER — PHENYLEPHRINE 40 MCG/ML (10ML) SYRINGE FOR IV PUSH (FOR BLOOD PRESSURE SUPPORT)
PREFILLED_SYRINGE | INTRAVENOUS | Status: AC
  Filled 2016-11-22: qty 10

## 2016-11-22 MED ORDER — BUPIVACAINE-EPINEPHRINE (PF) 0.5% -1:200000 IJ SOLN
INTRAMUSCULAR | Status: DC | PRN
  Administered 2016-11-22: 20 mL via PERINEURAL

## 2016-11-22 MED ORDER — METHOCARBAMOL 1000 MG/10ML IJ SOLN
500.0000 mg | Freq: Four times a day (QID) | INTRAVENOUS | Status: DC | PRN
  Filled 2016-11-22: qty 5

## 2016-11-22 MED ORDER — TRANEXAMIC ACID 1000 MG/10ML IV SOLN
1000.0000 mg | Freq: Once | INTRAVENOUS | Status: AC
  Administered 2016-11-22: 1000 mg via INTRAVENOUS
  Filled 2016-11-22: qty 10

## 2016-11-22 MED ORDER — LIDOCAINE 2% (20 MG/ML) 5 ML SYRINGE
INTRAMUSCULAR | Status: AC
  Filled 2016-11-22: qty 5

## 2016-11-22 MED ORDER — EPHEDRINE SULFATE-NACL 50-0.9 MG/10ML-% IV SOSY
PREFILLED_SYRINGE | INTRAVENOUS | Status: DC | PRN
  Administered 2016-11-22: 5 mg via INTRAVENOUS

## 2016-11-22 MED ORDER — ONDANSETRON HCL 4 MG/2ML IJ SOLN
INTRAMUSCULAR | Status: AC
  Filled 2016-11-22: qty 2

## 2016-11-22 MED ORDER — OXYCODONE-ACETAMINOPHEN 5-325 MG PO TABS: 1.0000 | ORAL_TABLET | ORAL | 0 refills | Status: AC | PRN

## 2016-11-22 MED ORDER — HYDROMORPHONE HCL 1 MG/ML IJ SOLN
0.5000 mg | INTRAMUSCULAR | Status: DC | PRN
  Administered 2016-11-22: 0.5 mg via INTRAVENOUS
  Administered 2016-11-22 – 2016-11-24 (×12): 1 mg via INTRAVENOUS
  Filled 2016-11-22 (×14): qty 1

## 2016-11-22 MED ORDER — TRANEXAMIC ACID 1000 MG/10ML IV SOLN
INTRAVENOUS | Status: AC | PRN
  Administered 2016-11-22: 2000 mg via TOPICAL

## 2016-11-22 MED ORDER — LACTATED RINGERS IV SOLN
INTRAVENOUS | Status: DC | PRN
  Administered 2016-11-22 (×2): via INTRAVENOUS

## 2016-11-22 MED ORDER — FLEET ENEMA 7-19 GM/118ML RE ENEM: 1.0000 | ENEMA | Freq: Once | RECTAL | Status: DC | PRN

## 2016-11-22 MED ORDER — MIDAZOLAM HCL 2 MG/2ML IJ SOLN
INTRAMUSCULAR | Status: AC
  Filled 2016-11-22: qty 2

## 2016-11-22 MED ORDER — BISACODYL 5 MG PO TBEC: 5.0000 mg | DELAYED_RELEASE_TABLET | Freq: Every day | ORAL | Status: DC | PRN

## 2016-11-22 MED ORDER — MENTHOL 3 MG MT LOZG: 1.0000 | LOZENGE | OROMUCOSAL | Status: DC | PRN

## 2016-11-22 MED ORDER — CHLORHEXIDINE GLUCONATE 4 % EX LIQD: 60.0000 mL | Freq: Once | CUTANEOUS | Status: DC

## 2016-11-22 MED ORDER — ONDANSETRON HCL 4 MG/2ML IJ SOLN: 4.0000 mg | Freq: Four times a day (QID) | INTRAMUSCULAR | Status: DC | PRN

## 2016-11-22 MED ORDER — METOCLOPRAMIDE HCL 5 MG/ML IJ SOLN: 5.0000 mg | Freq: Three times a day (TID) | INTRAMUSCULAR | Status: DC | PRN

## 2016-11-22 MED ORDER — 0.9 % SODIUM CHLORIDE (POUR BTL) OPTIME
TOPICAL | Status: DC | PRN
  Administered 2016-11-22: 1000 mL

## 2016-11-22 MED ORDER — METOCLOPRAMIDE HCL 5 MG PO TABS: 5.0000 mg | ORAL_TABLET | Freq: Three times a day (TID) | ORAL | Status: DC | PRN

## 2016-11-22 MED ORDER — DOCUSATE SODIUM 100 MG PO CAPS
100.0000 mg | ORAL_CAPSULE | Freq: Two times a day (BID) | ORAL | Status: DC
  Administered 2016-11-22 – 2016-11-25 (×6): 100 mg via ORAL
  Filled 2016-11-22 (×6): qty 1

## 2016-11-22 MED ORDER — PHENYLEPHRINE HCL 10 MG/ML IJ SOLN
INTRAVENOUS | Status: DC | PRN
  Administered 2016-11-22: 30 ug/min via INTRAVENOUS

## 2016-11-22 MED ORDER — DIPHENHYDRAMINE HCL 12.5 MG/5ML PO ELIX
12.5000 mg | ORAL_SOLUTION | ORAL | Status: DC | PRN
  Administered 2016-11-24: 25 mg via ORAL
  Filled 2016-11-22: qty 10

## 2016-11-22 MED ORDER — SODIUM CHLORIDE 0.9 % IR SOLN
Status: DC | PRN
  Administered 2016-11-22: 3000 mL

## 2016-11-22 MED ORDER — ACETAMINOPHEN 650 MG RE SUPP: 650.0000 mg | Freq: Four times a day (QID) | RECTAL | Status: DC | PRN

## 2016-11-22 MED ORDER — SODIUM CHLORIDE 0.9 % IJ SOLN
INTRAMUSCULAR | Status: DC | PRN
  Administered 2016-11-22: 50 mL

## 2016-11-22 MED ORDER — EPHEDRINE 5 MG/ML INJ
INTRAVENOUS | Status: AC
  Filled 2016-11-22: qty 10

## 2016-11-22 MED ORDER — FENTANYL CITRATE (PF) 100 MCG/2ML IJ SOLN
INTRAMUSCULAR | Status: DC | PRN
  Administered 2016-11-22: 50 ug via INTRAVENOUS

## 2016-11-22 MED ORDER — OXYCODONE HCL 5 MG PO TABS
5.0000 mg | ORAL_TABLET | ORAL | Status: DC | PRN
  Administered 2016-11-22 – 2016-11-23 (×5): 10 mg via ORAL
  Filled 2016-11-22 (×5): qty 2

## 2016-11-22 MED ORDER — PROPOFOL 500 MG/50ML IV EMUL
INTRAVENOUS | Status: DC | PRN
  Administered 2016-11-22: 50 ug/kg/min via INTRAVENOUS

## 2016-11-22 MED ORDER — ASPIRIN EC 325 MG PO TBEC
325.0000 mg | DELAYED_RELEASE_TABLET | Freq: Every day | ORAL | Status: DC
  Administered 2016-11-23 – 2016-11-25 (×3): 325 mg via ORAL
  Filled 2016-11-22 (×3): qty 1

## 2016-11-22 MED ORDER — KCL IN DEXTROSE-NACL 20-5-0.45 MEQ/L-%-% IV SOLN
INTRAVENOUS | Status: DC
  Administered 2016-11-22: 18:00:00 via INTRAVENOUS
  Filled 2016-11-22: qty 1000

## 2016-11-22 MED ORDER — ONDANSETRON HCL 4 MG PO TABS
4.0000 mg | ORAL_TABLET | Freq: Four times a day (QID) | ORAL | Status: DC | PRN
  Administered 2016-11-23 – 2016-11-24 (×2): 4 mg via ORAL
  Filled 2016-11-22 (×2): qty 1

## 2016-11-22 MED ORDER — PHENYLEPHRINE 40 MCG/ML (10ML) SYRINGE FOR IV PUSH (FOR BLOOD PRESSURE SUPPORT)
PREFILLED_SYRINGE | INTRAVENOUS | Status: DC | PRN
  Administered 2016-11-22 (×5): 80 ug via INTRAVENOUS

## 2016-11-22 MED ORDER — CEFUROXIME SODIUM 750 MG IJ SOLR
INTRAMUSCULAR | Status: AC
  Filled 2016-11-22: qty 1500

## 2016-11-22 MED ORDER — DEXTROSE-NACL 5-0.45 % IV SOLN: INTRAVENOUS | Status: DC

## 2016-11-22 MED ORDER — PHENOL 1.4 % MT LIQD: 1.0000 | OROMUCOSAL | Status: DC | PRN

## 2016-11-22 MED ORDER — MIDAZOLAM HCL 5 MG/5ML IJ SOLN
INTRAMUSCULAR | Status: DC | PRN
  Administered 2016-11-22 (×2): 1 mg via INTRAVENOUS

## 2016-11-22 MED ORDER — SENNOSIDES-DOCUSATE SODIUM 8.6-50 MG PO TABS
1.0000 | ORAL_TABLET | Freq: Every evening | ORAL | Status: DC | PRN
Start: 2016-11-22 — End: 2016-11-25

## 2016-11-22 SURGICAL SUPPLY — 52 items
BANDAGE ESMARK 6X9 LF (GAUZE/BANDAGES/DRESSINGS) ×1 IMPLANT
BLADE SAG 18X100X1.27 (BLADE) ×3 IMPLANT
BLADE SAGITTAL 13X1.27X60 (BLADE) IMPLANT
BLADE SAGITTAL 13X1.27X60MM (BLADE)
BLADE SAW SGTL 13X75X1.27 (BLADE) IMPLANT
BNDG ELASTIC 6X10 VLCR STRL LF (GAUZE/BANDAGES/DRESSINGS) ×3 IMPLANT
BNDG ESMARK 6X9 LF (GAUZE/BANDAGES/DRESSINGS) ×3
BOWL SMART MIX CTS (DISPOSABLE) ×3 IMPLANT
CAPT KNEE TOTAL 3 ATTUNE ×3 IMPLANT
CEMENT HV SMART SET (Cement) ×6 IMPLANT
COVER SURGICAL LIGHT HANDLE (MISCELLANEOUS) ×3 IMPLANT
CUFF TOURNIQUET SINGLE 34IN LL (TOURNIQUET CUFF) ×3 IMPLANT
CUFF TOURNIQUET SINGLE 44IN (TOURNIQUET CUFF) IMPLANT
DRAPE EXTREMITY T 121X128X90 (DRAPE) ×3 IMPLANT
DRAPE U-SHAPE 47X51 STRL (DRAPES) ×3 IMPLANT
DRSG AQUACEL AG ADV 3.5X10 (GAUZE/BANDAGES/DRESSINGS) ×3 IMPLANT
DURAPREP 26ML APPLICATOR (WOUND CARE) ×3 IMPLANT
ELECT REM PT RETURN 9FT ADLT (ELECTROSURGICAL) ×3
ELECTRODE REM PT RTRN 9FT ADLT (ELECTROSURGICAL) ×1 IMPLANT
GLOVE BIO SURGEON STRL SZ7.5 (GLOVE) ×3 IMPLANT
GLOVE BIO SURGEON STRL SZ8.5 (GLOVE) ×3 IMPLANT
GLOVE BIOGEL PI IND STRL 8 (GLOVE) ×1 IMPLANT
GLOVE BIOGEL PI IND STRL 9 (GLOVE) ×1 IMPLANT
GLOVE BIOGEL PI INDICATOR 8 (GLOVE) ×2
GLOVE BIOGEL PI INDICATOR 9 (GLOVE) ×2
GOWN STRL REUS W/ TWL LRG LVL3 (GOWN DISPOSABLE) ×1 IMPLANT
GOWN STRL REUS W/ TWL XL LVL3 (GOWN DISPOSABLE) ×2 IMPLANT
GOWN STRL REUS W/TWL LRG LVL3 (GOWN DISPOSABLE) ×2
GOWN STRL REUS W/TWL XL LVL3 (GOWN DISPOSABLE) ×4
HANDPIECE INTERPULSE COAX TIP (DISPOSABLE) ×2
HOOD PEEL AWAY FACE SHEILD DIS (HOOD) ×6 IMPLANT
IMMOBILIZER KNEE 22 UNIV (SOFTGOODS) ×3 IMPLANT
KIT BASIN OR (CUSTOM PROCEDURE TRAY) ×3 IMPLANT
KIT ROOM TURNOVER OR (KITS) ×3 IMPLANT
MANIFOLD NEPTUNE II (INSTRUMENTS) ×3 IMPLANT
NEEDLE 22X1 1/2 (OR ONLY) (NEEDLE) ×6 IMPLANT
NS IRRIG 1000ML POUR BTL (IV SOLUTION) ×3 IMPLANT
PACK TOTAL JOINT (CUSTOM PROCEDURE TRAY) ×3 IMPLANT
PAD ARMBOARD 7.5X6 YLW CONV (MISCELLANEOUS) ×6 IMPLANT
SET HNDPC FAN SPRY TIP SCT (DISPOSABLE) ×1 IMPLANT
SUT VIC AB 0 CT1 27 (SUTURE) ×2
SUT VIC AB 0 CT1 27XBRD ANBCTR (SUTURE) ×1 IMPLANT
SUT VIC AB 1 CTX 36 (SUTURE) ×2
SUT VIC AB 1 CTX36XBRD ANBCTR (SUTURE) ×1 IMPLANT
SUT VIC AB 2-0 CT1 27 (SUTURE) ×2
SUT VIC AB 2-0 CT1 TAPERPNT 27 (SUTURE) ×1 IMPLANT
SUT VIC AB 3-0 CT1 27 (SUTURE) ×2
SUT VIC AB 3-0 CT1 TAPERPNT 27 (SUTURE) ×1 IMPLANT
SYR CONTROL 10ML LL (SYRINGE) ×6 IMPLANT
TOWEL OR 17X24 6PK STRL BLUE (TOWEL DISPOSABLE) ×3 IMPLANT
TOWEL OR 17X26 10 PK STRL BLUE (TOWEL DISPOSABLE) ×3 IMPLANT
TRAY CATH 16FR W/PLASTIC CATH (SET/KITS/TRAYS/PACK) IMPLANT

## 2016-11-22 NOTE — Anesthesia Procedure Notes (Signed)
Anesthesia Regional Block: Adductor canal block   Pre-Anesthetic Checklist: ,, timeout performed, Correct Patient, Correct Site, Correct Laterality, Correct Procedure, Correct Position, site marked, Risks and benefits discussed, pre-op evaluation,  At surgeon's request and post-op pain management  Laterality: Left  Prep: chloraprep       Needles:   Needle Type: Echogenic Needle     Needle Length: 9cm  Needle Gauge: 21     Additional Needles:   Procedures: ultrasound guided,,,,,,,,  Narrative:  Start time: 11/22/2016 7:04 AM End time: 11/22/2016 7:07 AM Injection made incrementally with aspirations every 5 mL. Anesthesiologist: Cristela BlueJACKSON, Jorrell Kuster

## 2016-11-22 NOTE — Op Note (Signed)
PATIENT ID:      Joseph IsaacsDonald E Membreno  MRN:     [id number] DOB/AGE:    [birth year] / 76 y.o.       OPERATIVE REPORT    DATE OF PROCEDURE:  11/22/2016       PREOPERATIVE DIAGNOSIS:   LEFT KNEE OSTEOARTHRITIS       Estimated body mass index is 23.19 kg/m as calculated from the following:   Height as of 11/09/16: 6' (1.829 m).   Weight as of this encounter: 77.6 kg (171 lb).                                                        POSTOPERATIVE DIAGNOSIS:   LEFT KNEE OSTEOARTHRITIS                                                                       PROCEDURE:  Procedure(s): TOTAL KNEE ARTHROPLASTY Using DepuyAttune RP implants #8L Femur, #8Tibia, 5 mm Attune RP bearing, 41 Patella     SURGEON: Lynetta Tomczak J    ASSISTANT:   Eric K. [employer] PA-C   (Present and scrubbed throughout the case, critical for assistance with exposure, retraction, instrumentation, and closure.)         ANESTHESIA: Spinal, 20cc Exparel, 50cc 0.25% Marcaine  EBL: 200cc  FLUID REPLACEMENT: 1500 crystalloid  TOURNIQUET TIME: 15min  Drains: None  Tranexamic Acid: 1gm IV, 2gm topical  COMPLICATIONS:  None         INDICATIONS FOR PROCEDURE: The patient has  LEFT KNEE OSTEOARTHRITIS , Var deformities, XR shows bone on bone arthritis, lateral subluxation of tibia. Patient has failed all conservative measures including anti-inflammatory medicines, narcotics, attempts at  exercise and weight loss, cortisone injections and viscosupplementation.  Risks and benefits of surgery have been discussed, questions answered.   DESCRIPTION OF PROCEDURE: The patient identified by armband, received  IV antibiotics, [facility]. Patient taken to the operating room, appropriate anesthetic  monitors were attached, and Spinal anesthesia was  induced. Tourniquet  applied high to the operative thigh. Lateral post and foot positioner  applied to the table, the lower extremity was then prepped and draped  in usual  sterile fashion from the toes to the tourniquet. Time-out procedure was performed. We began the operation, with the knee flexed 120 degrees, by making the anterior midline incision starting at handbreadth above the patella going over the patella 1 cm medial to and 4 cm distal to the tibial tubercle. Small bleeders in the skin and the  subcutaneous tissue identified and cauterized. Transverse retinaculum was incised and reflected medially and a medial parapatellar arthrotomy was accomplished. the patella was everted and theprepatellar fat pad resected. The superficial medial collateral  ligament was then elevated from anterior to posterior along the proximal  flare of the tibia and anterior half of the menisci resected. The knee was hyperflexed exposing bone on bone arthritis. Peripheral and notch osteophytes as well as the cruciate ligaments were then resected. We continued to  work our way around posteriorly along the proximal  tibia, and externally  rotated the tibia subluxing it out from underneath the femur. A McHale  retractor was placed through the notch and a lateral Hohmann retractor  placed, and we then drilled through the proximal tibia in line with the  axis of the tibia followed by an intramedullary guide rod and 2-degree  posterior slope cutting guide. The tibial cutting guide, 3 degree posterior sloped, was pinned into place allowing resection of 4 mm of bone medially and 12 mm of bone laterally. Satisfied with the tibial resection, we then  entered the distal femur 2 mm anterior to the PCL origin with the  intramedullary guide rod and applied the distal femoral cutting guide  set at 9 mm, with 5 degrees of valgus. This was pinned along the  epicondylar axis. At this point, the distal femoral cut was accomplished without difficulty. We then sized for a #8L femoral component and pinned the guide in 3 degrees of external rotation. The chamfer cutting guide was pinned into place. The anterior,  posterior, and chamfer cuts were accomplished without difficulty followed by  the Attune RP box cutting guide and the box cut. We also removed posterior osteophytes from the posterior femoral condyles. At this  time, the knee was brought into full extension. We checked our  extension and flexion gaps and found them symmetric for a 5 mm bearing. Distracting in extension with a lamina spreader, the posterior horns of the menisci were removed, and Exparel, diluted to 60 cc, with 20cc NS, and 20cc 0.5% Marcaine,was injected into the capsule and synovium of the knee. The posterior patella cut was accomplished with the 9.5 mm Attune cutting guide, sized for a 41mm dome, and the fixation pegs drilled.The knee  was then once again hyperflexed exposing the proximal tibia. We sized for a # 8 tibial base plate, applied the smokestack and the conical reamer followed by the the Delta fin keel punch. We then hammered into place the Attune RP trial femoral component, drilled the lugs, inserted a  5 mm trial bearing, trial patellar button, and took the knee through range of motion from 0-130 degrees. No thumb pressure was required for patellar Tracking. At this point, the limb was wrapped with an Esmarch bandage and the tourniquet inflated to 350 mmHg. All trial components were removed, mating surfaces irrigated with pulse lavage, and dried with suction and sponges. 10 cc of the Exparel solution was applied to the cancellus bone of the patella distal femur and proximal tibia.  After waiting 1 minute, the bony surfaces were again, dried with sponges. A double batch of DePuy HV cement with 1500 mg of Zinacef was mixed and applied to all bony metallic mating surfaces except for the posterior condyles of the femur itself. In order, we hammered into place the tibial tray and removed excess cement, the femoral component and removed excess cement. The final Attune RP bearing  was inserted, and the knee brought to full extension with  compression.  The patellar button was clamped into place, and excess cement  removed. While the cement cured the wound was irrigated out with normal saline solution pulse lavage. Ligament stability and patellar tracking were checked and found to be excellent. The parapatellar arthrotomy was closed with  running #1 Vicryl suture. The subcutaneous tissue with 0 and 2-0 undyed  Vicryl suture, and the skin with running 3-0 SQ vicryl. A dressing of Xeroform,  4 x 4, dressing sponges, Webril, and Ace wrap applied. The patient  awakened, and  taken to recovery room without difficulty.   Joseph Berry J 11/22/2016, 8:38 AM

## 2016-11-22 NOTE — Interval H&P Note (Signed)
History and Physical Interval Note:  11/22/2016 7:13 AM  Joseph Berry  has presented today for surgery, with the diagnosis of LEFT KNEE OSTEOARTHRITIS   The various methods of treatment have been discussed with the patient and family. After consideration of risks, benefits and other options for treatment, the patient has consented to  Procedure(s): TOTAL KNEE ARTHROPLASTY (Left) as a surgical intervention .  The patient's history has been reviewed, patient examined, no change in status, stable for surgery.  I have reviewed the patient's chart and labs.  Questions were answered to the patient's satisfaction.     Nestor LewandowskyOWAN,Lyndzee Kliebert J

## 2016-11-22 NOTE — Anesthesia Procedure Notes (Signed)
Procedure Name: MAC Date/Time: 11/22/2016 7:33 AM Performed by: Orlie Dakin Pre-anesthesia Checklist: Patient identified, Emergency Drugs available, Suction available, Patient being monitored and Timeout performed Patient Re-evaluated:Patient Re-evaluated prior to induction Oxygen Delivery Method: Nasal cannula
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
## 2016-11-22 NOTE — Anesthesia Procedure Notes (Signed)
Procedure Name: MAC Date/Time: 11/22/2016 7:53 AM Performed by: Orlie Dakin Oxygen Delivery Method: Simple face mask

## 2016-11-22 NOTE — Anesthesia Procedure Notes (Signed)
Spinal  Patient location during procedure: OR Staffing Anesthesiologist: Cristela BlueJACKSON, Joseph Berry Preanesthetic Checklist Completed: patient identified, site marked, surgical consent, pre-op evaluation, timeout performed, IV checked, risks and benefits discussed and monitors and equipment checked Spinal Block Patient position: sitting Prep: DuraPrep Patient monitoring: heart rate, cardiac monitor, continuous pulse ox and blood pressure Approach: midline Location: L3-4 Injection technique: single-shot Needle Needle type: Sprotte  Needle gauge: 24 G Needle length: 9 cm Assessment Sensory level: T4 Additional Notes Spinal Dosage in OR  .75% Bupivicaine ml       1.9 LLD x 3 min

## 2016-11-22 NOTE — Progress Notes (Signed)
Orthopedic Tech Progress Note Patient Details:  Joseph Berry 02-Sep-1940 [id number]  Ortho Devices Ortho Device/Splint Location: foot roll Ortho Device/Splint Interventions: Application   Joseph Berry 11/22/2016, 9:55 AM

## 2016-11-22 NOTE — Transfer of Care (Signed)
Immediate Anesthesia Transfer of Care Note  Patient: Joseph IsaacsDonald E Dunklee  Procedure(s) Performed: Procedure(s): TOTAL KNEE ARTHROPLASTY (Left)  Patient Location: PACU  Anesthesia Type:Regional and Spinal  Level of Consciousness: awake, alert  and patient cooperative  Airway & Oxygen Therapy: Patient Spontanous Breathing  Post-op Assessment: Report given to RN and Post -op Vital signs reviewed and stable  Post vital signs: Reviewed and stable  Last Vitals:  Vitals:   11/22/16 0616 11/22/16 0917  BP: 128/75   Pulse: (!) 56   Resp: 20   Temp: 36.6 C (!) (P) 36.3 C    Last Pain:  Vitals:   11/22/16 0616  TempSrc: Oral  PainSc:       Patients Stated Pain Goal: 1 (11/22/16 [id number])  Complications: No apparent anesthesia complications

## 2016-11-23 ENCOUNTER — Encounter (HOSPITAL_COMMUNITY): Payer: Self-pay | Admitting: Orthopedic Surgery

## 2016-11-23 LAB — CBC
HEMATOCRIT: 37.4 % — AB (ref 39.0–52.0)
HEMOGLOBIN: 13.1 g/dL (ref 13.0–17.0)
MCH: 31.4 pg (ref 26.0–34.0)
MCHC: 35 g/dL (ref 30.0–36.0)
MCV: 89.7 fL (ref 78.0–100.0)
Platelets: 267 10*3/uL (ref 150–400)
RBC: 4.17 MIL/uL — AB (ref 4.22–5.81)
RDW: 12.4 % (ref 11.5–15.5)
WBC: 15.4 10*3/uL — ABNORMAL HIGH (ref 4.0–10.5)

## 2016-11-23 LAB — BASIC METABOLIC PANEL
Anion gap: 8 (ref 5–15)
BUN: 9 mg/dL (ref 6–20)
CHLORIDE: 101 mmol/L (ref 101–111)
CO2: 26 mmol/L (ref 22–32)
CREATININE: 0.97 mg/dL (ref 0.61–1.24)
Calcium: 9 mg/dL (ref 8.9–10.3)
GFR calc Af Amer: >60 mL/min (ref >60)
GFR calc non Af Amer: >60 mL/min (ref >60)
Glucose, Bld: 117 mg/dL — ABNORMAL HIGH (ref 65–99)
POTASSIUM: 4.2 mmol/L (ref 3.5–5.1)
Sodium: 135 mmol/L (ref 135–145)

## 2016-11-23 LAB — GLUCOSE, CAPILLARY
GLUCOSE-CAPILLARY: 109 mg/dL — AB (ref 65–99)
GLUCOSE-CAPILLARY: 105 mg/dL — AB (ref 65–99)
Glucose-Capillary: 117 mg/dL — ABNORMAL HIGH (ref 65–99)

## 2016-11-23 NOTE — Progress Notes (Signed)
PATIENT ID: Joseph IsaacsDonald E Driver  MRN: [id number]  DOB/AGE:  1941/03/08 / 76 y.o.  1 Day Post-Op Procedure(s) (LRB): TOTAL KNEE ARTHROPLASTY (Left)    PROGRESS NOTE Subjective: Patient is alert, oriented, no Nausea, no Vomiting, yes passing gas. Taking PO well. Denies SOB, Chest or Calf Pain. Using Incentive Spirometer, PAS in place. Ambulate WBAT, Patient reports pain as 2/10 .    Objective: Vital signs in last 24 hours: Vitals:   11/22/16 1500 11/22/16 2057 11/23/16 0018 11/23/16 0503  BP: 125/66 134/62 101/71 118/73  Pulse: (!) 54 (!) 58 (!) 59 (!) 59  Resp: 18 18 19 19   Temp: 97.6 F (36.4 C) 98.9 F (37.2 C) 98 F (36.7 C) 98 F (36.7 C)  TempSrc: Oral Oral Oral Oral  SpO2: 96% 98% 94% 95%  Weight:          Intake/Output from previous day: I/O last 3 completed shifts: In: 4372.9 [P.O.:1800; I.V.:2572.9] Out: 1950 [Urine:1900; Blood:50]   Intake/Output this shift: No intake/output data recorded.   LABORATORY DATA:  Recent Labs  11/23/16 0615  WBC 15.4*  HGB 13.1  HCT 37.4*  PLT 267  NA 135  K 4.2  CL 101  CO2 26  BUN 9  CREATININE 0.97  GLUCOSE 117*  CALCIUM 9.0    Examination: Neurologically intact ABD soft Neurovascular intact Sensation intact distally Intact pulses distally Dorsiflexion/Plantar flexion intact Incision: dressing C/D/I No cellulitis present Compartment soft} Can do SLR without problem  Assessment:   1 Day Post-Op Procedure(s) (LRB): TOTAL KNEE ARTHROPLASTY (Left) ADDITIONAL DIAGNOSIS: Expected Acute Blood Loss Anemia,   Plan: PT/OT WBAT, AROM and PROM  DVT Prophylaxis:  SCDx72hrs, ASA 325 mg BID x 2 weeks DISCHARGE PLAN: Home DISCHARGE NEEDS: HHPT, Walker and 3-in-1 comode seat     [name] 11/23/2016, 7:46 AM Patient ID: Joseph IsaacsDonald E Berry, male   DOB: 1941/03/08, 76 y.o.   MRN: [id number]
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
## 2016-11-23 NOTE — Progress Notes (Signed)
Transitions of Care - Pain Medication Review for Discharge to Home  Patient Joseph Berry is a 76 YO M hospitalized on 11/21/16, s/p L TKA.   PTA, patient is on hydrocodone-acetaminophen 5-325 mg po BID PRN, which is 10 mg of morphine equivalents daily. Of note, patient had this last filled on 10/07/16 and may not be taking this full amount daily. Upon interview, patient states that he takes his Norco truly PRN and will go some days without taking it at all. It makes him drowsy and he will go to sleep after taking it.   Pertinent co-morbid conditions include COPD and CVA.  CrCl=74.4 ml/min (using Cockcroft-Gault equation).  No other pertinent PTA medications.   [place] CSRS reviewed on 11/23/16 and shows that the patient should have 0 days worth of their home opiate pain medications remaining. Based on CSRS, the patient's home opioid seems to be a new prescription.    Today, patient reports 8/10 for left knee pain.   Current in-patient opioids include break-through therapy with hydromorphone 0.5-1 mg IV q2h prn severe, unresolved breakthrough pain, which the patient has received 1 mg approximately 3-4 times per day. He is also prescribed oxycodone 5-10 mg po q3h prn breakthrough pain, which he has received one 10 mg dose yesterday on 8/6.  Total current daily dose is equivalent to 58 mg morphine.   Non-opioid pain management includes Acetaminophen 650 mg po q6h prn mild pain, which the patient has received once yesterday on 11/22/16.   Patient states that his knee staying in one position causes pain, but mobilization also causes pain to be worse.    Repositioning helps to relieve his pain.   At home patient likes to use non-pharmacologic approaches to help manage his pain including repositioning and hot showers.   States that pain is located in his surgical site.   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   Upon questioning, patient appears alert and oriented.  --------------------------------------------------------------  Assessment/Recommendation:   1. Consider the following taper for the oxycodone-acetaminophen upon discharge:   -Days 1-4: 1 tab x 5 doses  -Days 5-7: 1 tab x 4 doses  -Days 8-10: 1 tab x 3 doses  -Goal is to discontinue by day 10.   2. As oxycodone-acetaminophen is tapered down, may start using acetaminophen 325-650 mg po q6h or as needed for pain, keeping in mind the maximum dose of 3 grams/day.    Counseling provided:  Spoke to patient regarding how he manages his pain at home and his previous experience with Norco. Discussed pain goals and set realistic expectations for his pain as he recovers. Discussed management of constipation with opioid use.
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
## 2016-11-24 LAB — CBC
HEMATOCRIT: 38.6 % — AB (ref 39.0–52.0)
HEMOGLOBIN: 13.2 g/dL (ref 13.0–17.0)
MCH: 31.4 pg (ref 26.0–34.0)
MCHC: 34.2 g/dL (ref 30.0–36.0)
MCV: 91.7 fL (ref 78.0–100.0)
Platelets: 252 10*3/uL (ref 150–400)
RBC: 4.21 MIL/uL — AB (ref 4.22–5.81)
RDW: 12.9 % (ref 11.5–15.5)
WBC: 9 10*3/uL (ref 4.0–10.5)

## 2016-11-24 LAB — GLUCOSE, CAPILLARY
Glucose-Capillary: 118 mg/dL — ABNORMAL HIGH (ref 65–99)
Glucose-Capillary: 103 mg/dL — ABNORMAL HIGH (ref 65–99)
Glucose-Capillary: 85 mg/dL (ref 65–99)

## 2016-11-24 MED ORDER — TRAMADOL HCL 50 MG PO TABS
50.0000 mg | ORAL_TABLET | ORAL | Status: DC | PRN
  Administered 2016-11-24 (×2): 50 mg via ORAL
  Filled 2016-11-24 (×2): qty 1

## 2016-11-24 MED ORDER — GABAPENTIN 300 MG PO CAPS
300.0000 mg | ORAL_CAPSULE | Freq: Three times a day (TID) | ORAL | Status: DC
  Administered 2016-11-24 – 2016-11-25 (×3): 300 mg via ORAL
  Filled 2016-11-24 (×3): qty 1

## 2016-11-24 MED ORDER — OXYCODONE HCL 5 MG PO TABS
5.0000 mg | ORAL_TABLET | ORAL | Status: DC | PRN
  Administered 2016-11-24 (×5): 10 mg via ORAL
  Filled 2016-11-24 (×6): qty 2

## 2016-11-24 NOTE — Progress Notes (Signed)
Pt was restless. Pt's O2 was 87. RN placed pt on 2L nasal cannula. Pt O2 level is now 97. RN notified MD.
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
## 2016-11-24 NOTE — Anesthesia Postprocedure Evaluation (Signed)
Anesthesia Post Note  Patient: Joseph Berry  Procedure(s) Performed: Procedure(s) (LRB): TOTAL KNEE ARTHROPLASTY (Left)     Patient location during evaluation: PACU Anesthesia Type: Spinal Level of consciousness: awake Pain management: satisfactory to patient Vital Signs Assessment: post-procedure vital signs reviewed and stable Respiratory status: spontaneous breathing Cardiovascular status: blood pressure returned to baseline Postop Assessment: no headache and spinal receding Anesthetic complications: no    Last Vitals:  Vitals:   11/23/16 2012 11/24/16 0421  BP: 133/75 115/63  Pulse: 72 81  Resp: 18 18  Temp: 37 C 37.4 C    Last Pain:  Vitals:   11/24/16 0909  TempSrc:   PainSc: 8    Pain Goal: Patients Stated Pain Goal: 3 (11/24/16 0609)               Jiles GarterJACKSON,Kelliann Pendergraph EDWARD
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
## 2016-11-24 NOTE — Progress Notes (Signed)
MD states to keep pt overnight and discharge tomorrow.
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
## 2016-11-24 NOTE — Progress Notes (Signed)
Pt states he has severe pain. RN called PA. PA gave verbal order for tramadol 50 mg q4h and gabapentin 300 mg TID.
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
## 2016-11-24 NOTE — Progress Notes (Signed)
PATIENT ID: Joseph Berry  MRN: [id number]  DOB/AGE:  1940/04/28 / 76 y.o.  2 Days Post-Op Procedure(s) (LRB): TOTAL KNEE ARTHROPLASTY (Left)    PROGRESS NOTE Subjective: Patient is alert, oriented, no Nausea, no Vomiting, yes passing gas. Taking PO well. Denies SOB, Chest or Calf Pain. Using Incentive Spirometer, PAS in place. Ambulate WBAT with pt walking 175 ft withtherapy, Patient reports pain as 6/10 .    Objective: Vital signs in last 24 hours: Vitals:   11/23/16 0503 11/23/16 1500 11/23/16 2012 11/24/16 0421  BP: 118/73 120/71 133/75 115/63  Pulse: (!) 59 60 72 81  Resp: 19 18 18 18   Temp: 98 F (36.7 C) 98.1 F (36.7 C) 98.6 F (37 C) 99.3 F (37.4 C)  TempSrc: Oral Oral Oral Oral  SpO2: 95% 98% 97% 97%  Weight:          Intake/Output from previous day: I/O last 3 completed shifts: In: 3912.9 [P.O.:2640; I.V.:1272.9] Out: 2650 [Urine:2650]   Intake/Output this shift: No intake/output data recorded.   LABORATORY DATA:  Recent Labs  11/23/16 0615  11/23/16 1645 11/23/16 2101 11/24/16 0404 11/24/16 0653  WBC 15.4*  --   --   --  9.0  --   HGB 13.1  --   --   --  13.2  --   HCT 37.4*  --   --   --  38.6*  --   PLT 267  --   --   --  252  --   NA 135  --   --   --   --   --   K 4.2  --   --   --   --   --   CL 101  --   --   --   --   --   CO2 26  --   --   --   --   --   BUN 9  --   --   --   --   --   CREATININE 0.97  --   --   --   --   --   GLUCOSE 117*  --   --   --   --   --   GLUCAP  --   < > 109* 105*  --  118*  CALCIUM 9.0  --   --   --   --   --   < > = values in this interval not displayed.  Examination: Neurologically intact Neurovascular intact Sensation intact distally Intact pulses distally Dorsiflexion/Plantar flexion intact Incision: dressing C/D/I and no drainage No cellulitis present Compartment soft}  Assessment:   2 Days Post-Op Procedure(s) (LRB): TOTAL KNEE ARTHROPLASTY (Left) ADDITIONAL DIAGNOSIS: Expected Acute Blood  Loss Anemia,    Plan: PT/OT WBAT, AROM and PROM  DVT Prophylaxis:  SCDx72hrs, ASA 325 mg BID x 2 weeks DISCHARGE PLAN: Home, later today DISCHARGE NEEDS: HHPT, Walker and 3-in-1 comode seat     PHILLIPS, ERIC R 11/24/2016, 8:02 AM

## 2016-11-24 NOTE — Care Management Note (Signed)
Case Management Note  Patient Details  Name: Ocie DoyneDonald E Stivers MRN: [id number] Date of Birth: [birth year]  Subjective/Objective:  76 yr old male s/p left total knee arthroplasty.                  Action/Plan: Case manager spoke with patient and his wife concerning Home Health and DME needs. Patient was preoperatively setup with [facility], no changes. DME has been delivered to patient. He will have family support at discharge.    Expected Discharge Date:  11/24/16               Expected Discharge Plan:  Home w Home Health Services  In-House Referral:  NA  Discharge planning Services  CM Consult  Post Acute Care Choice:  Home Health Choice offered to:  Patient, Spouse  DME Arranged:  Walker rolling, CPM DME Agency:  [employer]  HH Arranged:  PT HH Agency:  [facility]  Status of Service:  Completed, signed off  If discussed at Long Length of Stay Meetings, dates discussed:    Additional Comments:  Durenda GuthrieBrady, Tayia Stonesifer Naomi, RN 11/24/2016, 11:02 AM

## 2016-11-24 NOTE — Progress Notes (Signed)
Pt discharge instructions reviewed with pt and wife. Pt and wife verbalize understanding. Pt family driving him home. Pt belongings with pt. Pt discharged via wheelchair. Pt is not in distress.

## 2016-11-24 NOTE — Progress Notes (Signed)
RN gave pt discharge papers and removed IV. Before pt left, he stated severe pain and was shaking. RN called PA. PA states to give pt tramadol and gabapentin and in two hours call back if the pt still has severe pain.
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
## 2016-11-25 LAB — CBC
HEMATOCRIT: 37.4 % — AB (ref 39.0–52.0)
HEMOGLOBIN: 12.8 g/dL — AB (ref 13.0–17.0)
MCH: 31.7 pg (ref 26.0–34.0)
MCHC: 34.2 g/dL (ref 30.0–36.0)
MCV: 92.6 fL (ref 78.0–100.0)
Platelets: 225 10*3/uL (ref 150–400)
RBC: 4.04 MIL/uL — ABNORMAL LOW (ref 4.22–5.81)
RDW: 12.8 % (ref 11.5–15.5)
WBC: 9.7 10*3/uL (ref 4.0–10.5)

## 2016-11-25 MED ORDER — OXYCODONE HCL 5 MG PO TABS
5.0000 mg | ORAL_TABLET | ORAL | Status: DC | PRN
  Administered 2016-11-25 (×4): 10 mg via ORAL
  Filled 2016-11-25 (×3): qty 2

## 2016-11-25 MED ORDER — TRAMADOL HCL 50 MG PO TABS: 50.0000 mg | ORAL_TABLET | ORAL | Status: DC | PRN

## 2016-11-25 NOTE — Progress Notes (Signed)
Pt ready for discharge. Education/instructions reviewed with pt again, and confirmed that all paperwork/prescriptions were with pt's wife. Pt will be transported out via wheelchair to son's vehicle. Will continue to monitor
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
## 2017-10-29 ENCOUNTER — Emergency Department (HOSPITAL_BASED_OUTPATIENT_CLINIC_OR_DEPARTMENT_OTHER): Payer: Medicare Other

## 2017-10-29 ENCOUNTER — Other Ambulatory Visit: Payer: Self-pay

## 2017-10-29 ENCOUNTER — Encounter (HOSPITAL_BASED_OUTPATIENT_CLINIC_OR_DEPARTMENT_OTHER): Payer: Self-pay | Admitting: Emergency Medicine

## 2017-10-29 ENCOUNTER — Emergency Department (HOSPITAL_BASED_OUTPATIENT_CLINIC_OR_DEPARTMENT_OTHER)
Admission: EM | Admit: 2017-10-29 | Discharge: 2017-10-29 | Disposition: A | Discharge status: Home / Self Care | Payer: Medicare Other | Attending: Emergency Medicine | Admitting: Emergency Medicine

## 2017-10-29 DIAGNOSIS — S8002XA Contusion of left knee, initial encounter: Secondary | ICD-10-CM | POA: Diagnosis not present

## 2017-10-29 DIAGNOSIS — Z79899 Other long term (current) drug therapy: Secondary | ICD-10-CM | POA: Insufficient documentation

## 2017-10-29 DIAGNOSIS — S0990XA Unspecified injury of head, initial encounter: Secondary | ICD-10-CM | POA: Diagnosis not present

## 2017-10-29 DIAGNOSIS — J449 Chronic obstructive pulmonary disease, unspecified: Secondary | ICD-10-CM | POA: Insufficient documentation

## 2017-10-29 DIAGNOSIS — F17210 Nicotine dependence, cigarettes, uncomplicated: Secondary | ICD-10-CM | POA: Diagnosis not present

## 2017-10-29 DIAGNOSIS — Y998 Other external cause status: Secondary | ICD-10-CM | POA: Insufficient documentation

## 2017-10-29 DIAGNOSIS — Z8673 Personal history of transient ischemic attack (TIA), and cerebral infarction without residual deficits: Secondary | ICD-10-CM | POA: Insufficient documentation

## 2017-10-29 DIAGNOSIS — Z7982 Long term (current) use of aspirin: Secondary | ICD-10-CM | POA: Diagnosis not present

## 2017-10-29 DIAGNOSIS — Y92008 Other place in unspecified non-institutional (private) residence as the place of occurrence of the external cause: Secondary | ICD-10-CM | POA: Insufficient documentation

## 2017-10-29 DIAGNOSIS — Y939 Activity, unspecified: Secondary | ICD-10-CM | POA: Diagnosis not present

## 2017-10-29 MED ORDER — ONDANSETRON 4 MG PO TBDP
4.0000 mg | ORAL_TABLET | Freq: Once | ORAL | Status: AC
Start: 2017-10-29 — End: 2017-10-29
  Administered 2017-10-29: 4 mg via ORAL
  Filled 2017-10-29: qty 1

## 2017-10-29 MED ORDER — OXYCODONE-ACETAMINOPHEN 5-325 MG PO TABS
1.0000 | ORAL_TABLET | Freq: Once | ORAL | Status: AC
  Administered 2017-10-29: 1 via ORAL
  Filled 2017-10-29: qty 1
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
## 2017-10-29 NOTE — ED Notes (Signed)
Pt ambulated to bathroom without difficulty. Pt has been drinking coffee for PO challenge

## 2017-10-29 NOTE — ED Notes (Signed)
Patient transported to CT 
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
## 2019-01-13 ENCOUNTER — Encounter (HOSPITAL_BASED_OUTPATIENT_CLINIC_OR_DEPARTMENT_OTHER): Payer: Self-pay | Admitting: Emergency Medicine

## 2019-01-13 ENCOUNTER — Emergency Department (HOSPITAL_BASED_OUTPATIENT_CLINIC_OR_DEPARTMENT_OTHER)
Admission: EM | Admit: 2019-01-13 | Discharge: 2019-01-13 | Disposition: A | Discharge status: Home / Self Care | Payer: Medicare Other | Attending: Emergency Medicine | Admitting: Emergency Medicine

## 2019-01-13 ENCOUNTER — Other Ambulatory Visit: Payer: Self-pay

## 2019-01-13 DIAGNOSIS — J449 Chronic obstructive pulmonary disease, unspecified: Secondary | ICD-10-CM | POA: Insufficient documentation

## 2019-01-13 DIAGNOSIS — Z888 Allergy status to other drugs, medicaments and biological substances status: Secondary | ICD-10-CM | POA: Insufficient documentation

## 2019-01-13 DIAGNOSIS — Z79899 Other long term (current) drug therapy: Secondary | ICD-10-CM | POA: Insufficient documentation

## 2019-01-13 DIAGNOSIS — F17210 Nicotine dependence, cigarettes, uncomplicated: Secondary | ICD-10-CM | POA: Insufficient documentation

## 2019-01-13 DIAGNOSIS — R21 Rash and other nonspecific skin eruption: Secondary | ICD-10-CM | POA: Diagnosis present

## 2019-01-13 DIAGNOSIS — B029 Zoster without complications: Secondary | ICD-10-CM | POA: Diagnosis not present

## 2019-01-13 MED ORDER — TRAMADOL HCL 50 MG PO TABS: 50.0000 mg | ORAL_TABLET | Freq: Four times a day (QID) | ORAL | 0 refills | Status: AC | PRN

## 2019-01-13 MED ORDER — VALACYCLOVIR HCL 1 G PO TABS: 1000.0000 mg | ORAL_TABLET | Freq: Three times a day (TID) | ORAL | 0 refills | Status: AC

## 2019-01-13 NOTE — ED Notes (Signed)
ED Provider at bedside. 

## 2019-01-13 NOTE — ED Triage Notes (Signed)
Patient states that he has had a rash to the left side of his neck for about a week - he went to urgent care and was told it was shingles. The patient reports that it is now spread and it is not getting better

## 2019-01-13 NOTE — Discharge Instructions (Addendum)
Begin taking Valtrex as prescribed.  Tramadol as prescribed as needed for pain.  Follow-up with primary doctor if symptoms or not improving in the next few days, and return to the ER if symptoms significantly worsen or change in the meantime.
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
## 2019-06-30 ENCOUNTER — Other Ambulatory Visit: Payer: Self-pay

## 2019-06-30 ENCOUNTER — Emergency Department (HOSPITAL_BASED_OUTPATIENT_CLINIC_OR_DEPARTMENT_OTHER): Payer: Medicare Other

## 2019-06-30 ENCOUNTER — Encounter (HOSPITAL_BASED_OUTPATIENT_CLINIC_OR_DEPARTMENT_OTHER): Payer: Self-pay | Admitting: Emergency Medicine

## 2019-06-30 ENCOUNTER — Emergency Department (HOSPITAL_BASED_OUTPATIENT_CLINIC_OR_DEPARTMENT_OTHER)
Admission: EM | Admit: 2019-06-30 | Discharge: 2019-06-30 | Disposition: A | Discharge status: Home / Self Care | Payer: Medicare Other | Attending: Emergency Medicine | Admitting: Emergency Medicine

## 2019-06-30 DIAGNOSIS — F17210 Nicotine dependence, cigarettes, uncomplicated: Secondary | ICD-10-CM | POA: Insufficient documentation

## 2019-06-30 DIAGNOSIS — Z96652 Presence of left artificial knee joint: Secondary | ICD-10-CM | POA: Insufficient documentation

## 2019-06-30 DIAGNOSIS — J449 Chronic obstructive pulmonary disease, unspecified: Secondary | ICD-10-CM | POA: Diagnosis not present

## 2019-06-30 DIAGNOSIS — M79642 Pain in left hand: Secondary | ICD-10-CM | POA: Diagnosis not present

## 2019-06-30 DIAGNOSIS — M79641 Pain in right hand: Secondary | ICD-10-CM | POA: Diagnosis not present

## 2019-06-30 DIAGNOSIS — M1990 Unspecified osteoarthritis, unspecified site: Secondary | ICD-10-CM | POA: Diagnosis not present

## 2019-06-30 DIAGNOSIS — M25562 Pain in left knee: Secondary | ICD-10-CM | POA: Insufficient documentation

## 2019-06-30 DIAGNOSIS — M25561 Pain in right knee: Secondary | ICD-10-CM | POA: Diagnosis not present

## 2019-06-30 LAB — CBC WITH DIFFERENTIAL/PLATELET
Abs Immature Granulocytes: 0.03 10*3/uL (ref 0.00–0.07)
Basophils Absolute: 0.1 10*3/uL (ref 0.0–0.1)
Basophils Relative: 1 %
Eosinophils Absolute: 0.1 10*3/uL (ref 0.0–0.5)
Eosinophils Relative: 2 %
HCT: 42.5 % (ref 39.0–52.0)
Hemoglobin: 14.5 g/dL (ref 13.0–17.0)
Immature Granulocytes: 0 %
Lymphocytes Relative: 16 %
Lymphs Abs: 1.4 10*3/uL (ref 0.7–4.0)
MCH: 31.7 pg (ref 26.0–34.0)
MCHC: 34.1 g/dL (ref 30.0–36.0)
MCV: 92.8 fL (ref 80.0–100.0)
Monocytes Absolute: 1 10*3/uL (ref 0.1–1.0)
Monocytes Relative: 12 %
Neutro Abs: 6 10*3/uL (ref 1.7–7.7)
Neutrophils Relative %: 69 %
Platelets: 358 10*3/uL (ref 150–400)
RBC: 4.58 MIL/uL (ref 4.22–5.81)
RDW: 12.5 % (ref 11.5–15.5)
WBC: 8.7 10*3/uL (ref 4.0–10.5)
nRBC: 0 % (ref 0.0–0.2)

## 2019-06-30 LAB — BASIC METABOLIC PANEL
Anion gap: 9 (ref 5–15)
BUN: 17 mg/dL (ref 8–23)
CO2: 23 mmol/L (ref 22–32)
Calcium: 9 mg/dL (ref 8.9–10.3)
Chloride: 104 mmol/L (ref 98–111)
Creatinine, Ser: 1.02 mg/dL (ref 0.61–1.24)
GFR calc Af Amer: >60 mL/min (ref >60)
GFR calc non Af Amer: >60 mL/min (ref >60)
Glucose, Bld: 102 mg/dL — ABNORMAL HIGH (ref 70–99)
Potassium: 4.1 mmol/L (ref 3.5–5.1)
Sodium: 136 mmol/L (ref 135–145)

## 2019-06-30 MED ORDER — DEXAMETHASONE SODIUM PHOSPHATE 10 MG/ML IJ SOLN
10.0000 mg | Freq: Once | INTRAMUSCULAR | Status: AC
  Administered 2019-06-30: 12:00:00 10 mg via INTRAVENOUS
  Filled 2019-06-30: qty 1

## 2019-06-30 MED ORDER — MORPHINE SULFATE (PF) 4 MG/ML IV SOLN
4.0000 mg | Freq: Once | INTRAVENOUS | Status: AC
  Administered 2019-06-30: 4 mg via INTRAVENOUS
  Filled 2019-06-30: qty 1

## 2019-06-30 MED ORDER — HYDROCODONE-ACETAMINOPHEN 5-325 MG PO TABS: 1.0000 | ORAL_TABLET | ORAL | 0 refills | Status: DC | PRN

## 2019-06-30 MED ORDER — ONDANSETRON HCL 4 MG/2ML IJ SOLN
4.0000 mg | Freq: Once | INTRAMUSCULAR | Status: AC
  Administered 2019-06-30: 12:00:00 4 mg via INTRAVENOUS
  Filled 2019-06-30: qty 2

## 2019-06-30 MED ORDER — PREDNISONE 10 MG (21) PO TBPK: ORAL_TABLET | ORAL | 0 refills | Status: AC

## 2019-06-30 NOTE — ED Triage Notes (Signed)
Bilateral knee and hand pain and swelling x 2 weeks.
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
## 2020-08-17 ENCOUNTER — Other Ambulatory Visit (HOSPITAL_BASED_OUTPATIENT_CLINIC_OR_DEPARTMENT_OTHER): Payer: Self-pay | Admitting: Radiology

## 2020-08-17 ENCOUNTER — Emergency Department (HOSPITAL_BASED_OUTPATIENT_CLINIC_OR_DEPARTMENT_OTHER): Payer: Medicare Other

## 2020-08-17 ENCOUNTER — Encounter (HOSPITAL_BASED_OUTPATIENT_CLINIC_OR_DEPARTMENT_OTHER): Payer: Self-pay | Admitting: Emergency Medicine

## 2020-08-17 ENCOUNTER — Other Ambulatory Visit: Payer: Self-pay

## 2020-08-17 ENCOUNTER — Emergency Department (HOSPITAL_BASED_OUTPATIENT_CLINIC_OR_DEPARTMENT_OTHER)
Admission: EM | Admit: 2020-08-17 | Discharge: 2020-08-17 | Disposition: A | Discharge status: Home / Self Care | Payer: Medicare Other | Attending: Emergency Medicine | Admitting: Emergency Medicine

## 2020-08-17 DIAGNOSIS — Z96652 Presence of left artificial knee joint: Secondary | ICD-10-CM | POA: Insufficient documentation

## 2020-08-17 DIAGNOSIS — J449 Chronic obstructive pulmonary disease, unspecified: Secondary | ICD-10-CM | POA: Diagnosis not present

## 2020-08-17 DIAGNOSIS — R0600 Dyspnea, unspecified: Secondary | ICD-10-CM | POA: Insufficient documentation

## 2020-08-17 DIAGNOSIS — Z7951 Long term (current) use of inhaled steroids: Secondary | ICD-10-CM | POA: Diagnosis not present

## 2020-08-17 DIAGNOSIS — F17210 Nicotine dependence, cigarettes, uncomplicated: Secondary | ICD-10-CM | POA: Diagnosis not present

## 2020-08-17 DIAGNOSIS — Z7982 Long term (current) use of aspirin: Secondary | ICD-10-CM | POA: Insufficient documentation

## 2020-08-17 DIAGNOSIS — R5383 Other fatigue: Secondary | ICD-10-CM | POA: Insufficient documentation

## 2020-08-17 LAB — TROPONIN I (HIGH SENSITIVITY)
Troponin I (High Sensitivity): 3 ng/L (ref <18)
Troponin I (High Sensitivity): 3 ng/L (ref <18)

## 2020-08-17 LAB — COMPREHENSIVE METABOLIC PANEL
ALT: 11 U/L (ref 0–44)
AST: 18 U/L (ref 15–41)
Albumin: 3.9 g/dL (ref 3.5–5.0)
Alkaline Phosphatase: 67 U/L (ref 38–126)
Anion gap: 9 (ref 5–15)
BUN: 12 mg/dL (ref 8–23)
CO2: 24 mmol/L (ref 22–32)
Calcium: 9.2 mg/dL (ref 8.9–10.3)
Chloride: 102 mmol/L (ref 98–111)
Creatinine, Ser: 1.04 mg/dL (ref 0.61–1.24)
GFR, Estimated: >60 mL/min (ref >60)
Glucose, Bld: 94 mg/dL (ref 70–99)
Potassium: 3.9 mmol/L (ref 3.5–5.1)
Sodium: 135 mmol/L (ref 135–145)
Total Bilirubin: 0.6 mg/dL (ref 0.3–1.2)
Total Protein: 6.7 g/dL (ref 6.5–8.1)

## 2020-08-17 LAB — BRAIN NATRIURETIC PEPTIDE: B Natriuretic Peptide: 24.9 pg/mL (ref 0.0–100.0)

## 2020-08-17 LAB — CBC WITH DIFFERENTIAL/PLATELET
Abs Immature Granulocytes: 0.02 10*3/uL (ref 0.00–0.07)
Basophils Absolute: 0.1 10*3/uL (ref 0.0–0.1)
Basophils Relative: 2 %
Eosinophils Absolute: 0.2 10*3/uL (ref 0.0–0.5)
Eosinophils Relative: 2 %
HCT: 44.6 % (ref 39.0–52.0)
Hemoglobin: 15.4 g/dL (ref 13.0–17.0)
Immature Granulocytes: 0 %
Lymphocytes Relative: 30 %
Lymphs Abs: 2 10*3/uL (ref 0.7–4.0)
MCH: 31.5 pg (ref 26.0–34.0)
MCHC: 34.5 g/dL (ref 30.0–36.0)
MCV: 91.2 fL (ref 80.0–100.0)
Monocytes Absolute: 0.7 10*3/uL (ref 0.1–1.0)
Monocytes Relative: 11 %
Neutro Abs: 3.8 10*3/uL (ref 1.7–7.7)
Neutrophils Relative %: 55 %
Platelets: 259 10*3/uL (ref 150–400)
RBC: 4.89 MIL/uL (ref 4.22–5.81)
RDW: 12.9 % (ref 11.5–15.5)
WBC: 6.8 10*3/uL (ref 4.0–10.5)
nRBC: 0 % (ref 0.0–0.2)

## 2020-08-17 LAB — D-DIMER, QUANTITATIVE: D-Dimer, Quant: 0.91 ug/mL-FEU — ABNORMAL HIGH (ref 0.00–0.50)

## 2020-08-17 LAB — MAGNESIUM: Magnesium: 2.2 mg/dL (ref 1.7–2.4)

## 2020-08-17 LAB — TSH: TSH: 0.766 u[IU]/mL (ref 0.350–4.500)

## 2020-08-17 MED ORDER — IOHEXOL 350 MG/ML SOLN
100.0000 mL | Freq: Once | INTRAVENOUS | Status: AC | PRN
  Administered 2020-08-17: 100 mL via INTRAVENOUS

## 2020-08-17 NOTE — ED Notes (Signed)
ED Provider at bedside. 

## 2020-08-17 NOTE — ED Triage Notes (Addendum)
Reports kidney disease, sob, decreased energy. Chronic knee pain, and rash across the chest.  Reports the sob and decreased energy has been going on for months.  No apparent distress noted in triage.
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
## 2020-08-17 NOTE — ED Notes (Signed)
Patient transported to X-ray 

## 2020-08-17 NOTE — ED Notes (Signed)
Pt returned from CT °

## 2021-03-06 ENCOUNTER — Emergency Department (HOSPITAL_BASED_OUTPATIENT_CLINIC_OR_DEPARTMENT_OTHER): Payer: Medicare Other

## 2021-03-06 ENCOUNTER — Other Ambulatory Visit: Payer: Self-pay

## 2021-03-06 ENCOUNTER — Encounter (HOSPITAL_BASED_OUTPATIENT_CLINIC_OR_DEPARTMENT_OTHER): Payer: Self-pay

## 2021-03-06 ENCOUNTER — Emergency Department (HOSPITAL_BASED_OUTPATIENT_CLINIC_OR_DEPARTMENT_OTHER)
Admission: EM | Admit: 2021-03-06 | Discharge: 2021-03-06 | Disposition: A | Discharge status: Home / Self Care | Payer: Medicare Other | Attending: Emergency Medicine | Admitting: Emergency Medicine

## 2021-03-06 DIAGNOSIS — W5501XA Bitten by cat, initial encounter: Secondary | ICD-10-CM | POA: Diagnosis not present

## 2021-03-06 DIAGNOSIS — S61051A Open bite of right thumb without damage to nail, initial encounter: Secondary | ICD-10-CM | POA: Insufficient documentation

## 2021-03-06 DIAGNOSIS — L02511 Cutaneous abscess of right hand: Secondary | ICD-10-CM

## 2021-03-06 DIAGNOSIS — F17210 Nicotine dependence, cigarettes, uncomplicated: Secondary | ICD-10-CM | POA: Insufficient documentation

## 2021-03-06 DIAGNOSIS — Z8673 Personal history of transient ischemic attack (TIA), and cerebral infarction without residual deficits: Secondary | ICD-10-CM | POA: Diagnosis not present

## 2021-03-06 DIAGNOSIS — J449 Chronic obstructive pulmonary disease, unspecified: Secondary | ICD-10-CM | POA: Diagnosis not present

## 2021-03-06 DIAGNOSIS — Z79899 Other long term (current) drug therapy: Secondary | ICD-10-CM | POA: Insufficient documentation

## 2021-03-06 DIAGNOSIS — Z23 Encounter for immunization: Secondary | ICD-10-CM | POA: Diagnosis not present

## 2021-03-06 DIAGNOSIS — Z7982 Long term (current) use of aspirin: Secondary | ICD-10-CM | POA: Diagnosis not present

## 2021-03-06 LAB — BASIC METABOLIC PANEL
Anion gap: 7 (ref 5–15)
BUN: 9 mg/dL (ref 8–23)
CO2: 24 mmol/L (ref 22–32)
Calcium: 9.2 mg/dL (ref 8.9–10.3)
Chloride: 102 mmol/L (ref 98–111)
Creatinine, Ser: 0.97 mg/dL (ref 0.61–1.24)
GFR, Estimated: >60 mL/min (ref >60)
Glucose, Bld: 95 mg/dL (ref 70–99)
Potassium: 4.8 mmol/L (ref 3.5–5.1)
Sodium: 133 mmol/L — ABNORMAL LOW (ref 135–145)

## 2021-03-06 LAB — CBC WITH DIFFERENTIAL/PLATELET
Abs Immature Granulocytes: 0.03 10*3/uL (ref 0.00–0.07)
Basophils Absolute: 0.1 10*3/uL (ref 0.0–0.1)
Basophils Relative: 2 %
Eosinophils Absolute: 0.1 10*3/uL (ref 0.0–0.5)
Eosinophils Relative: 2 %
HCT: 42.7 % (ref 39.0–52.0)
Hemoglobin: 15 g/dL (ref 13.0–17.0)
Immature Granulocytes: 1 %
Lymphocytes Relative: 29 %
Lymphs Abs: 1.9 10*3/uL (ref 0.7–4.0)
MCH: 31.8 pg (ref 26.0–34.0)
MCHC: 35.1 g/dL (ref 30.0–36.0)
MCV: 90.7 fL (ref 80.0–100.0)
Monocytes Absolute: 0.7 10*3/uL (ref 0.1–1.0)
Monocytes Relative: 11 %
Neutro Abs: 3.5 10*3/uL (ref 1.7–7.7)
Neutrophils Relative %: 55 %
Platelets: 343 10*3/uL (ref 150–400)
RBC: 4.71 MIL/uL (ref 4.22–5.81)
RDW: 13.1 % (ref 11.5–15.5)
WBC: 6.3 10*3/uL (ref 4.0–10.5)
nRBC: 0 % (ref 0.0–0.2)

## 2021-03-06 MED ORDER — HYDROCODONE-ACETAMINOPHEN 5-325 MG PO TABS: 1.0000 | ORAL_TABLET | Freq: Four times a day (QID) | ORAL | 0 refills | Status: AC | PRN

## 2021-03-06 MED ORDER — LIDOCAINE HCL 2 % IJ SOLN
10.0000 mL | Freq: Once | INTRAMUSCULAR | Status: AC
  Administered 2021-03-06: 200 mg
  Filled 2021-03-06: qty 20

## 2021-03-06 MED ORDER — TETANUS-DIPHTH-ACELL PERTUSSIS 5-2.5-18.5 LF-MCG/0.5 IM SUSY
0.5000 mL | PREFILLED_SYRINGE | Freq: Once | INTRAMUSCULAR | Status: AC
  Administered 2021-03-06: 0.5 mL via INTRAMUSCULAR
  Filled 2021-03-06: qty 0.5

## 2021-03-06 MED ORDER — PIPERACILLIN-TAZOBACTAM 3.375 G IVPB 30 MIN
3.3750 g | Freq: Once | INTRAVENOUS | Status: AC
  Administered 2021-03-06: 3.375 g via INTRAVENOUS
  Filled 2021-03-06: qty 50

## 2021-03-06 MED ORDER — AMOXICILLIN-POT CLAVULANATE 875-125 MG PO TABS: 1.0000 | ORAL_TABLET | Freq: Two times a day (BID) | ORAL | 0 refills | Status: AC

## 2021-03-06 MED ORDER — MORPHINE SULFATE (PF) 4 MG/ML IV SOLN
4.0000 mg | Freq: Once | INTRAVENOUS | Status: AC
  Administered 2021-03-06: 4 mg via INTRAVENOUS
  Filled 2021-03-06: qty 1

## 2021-03-06 NOTE — Discharge Instructions (Addendum)
Take motrin for pain   Take vicodin for severe pain   Take augmentin twice daily for a week   Return in 2 days for wound check or follow up on Monday with hand surgeon   Return earlier if you have fever, severe pain, purulent discharge
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
## 2021-03-06 NOTE — ED Notes (Signed)
Pt gone to xray

## 2021-03-06 NOTE — ED Triage Notes (Signed)
Pt reports bit by a cat on right thumb approximately 3 days ago. Reports cat is one of his rescue cats. Right thumb swollen, red and painful

## 2021-03-08 ENCOUNTER — Emergency Department (HOSPITAL_BASED_OUTPATIENT_CLINIC_OR_DEPARTMENT_OTHER)
Admission: EM | Admit: 2021-03-08 | Discharge: 2021-03-08 | Disposition: A | Discharge status: Home / Self Care | Payer: Medicare Other | Attending: Emergency Medicine | Admitting: Emergency Medicine

## 2021-03-08 ENCOUNTER — Other Ambulatory Visit: Payer: Self-pay

## 2021-03-08 ENCOUNTER — Encounter (HOSPITAL_BASED_OUTPATIENT_CLINIC_OR_DEPARTMENT_OTHER): Payer: Self-pay | Admitting: Emergency Medicine

## 2021-03-08 DIAGNOSIS — S61051A Open bite of right thumb without damage to nail, initial encounter: Secondary | ICD-10-CM | POA: Insufficient documentation

## 2021-03-08 DIAGNOSIS — W5501XD Bitten by cat, subsequent encounter: Secondary | ICD-10-CM

## 2021-03-08 DIAGNOSIS — Z96652 Presence of left artificial knee joint: Secondary | ICD-10-CM | POA: Insufficient documentation

## 2021-03-08 DIAGNOSIS — Z7982 Long term (current) use of aspirin: Secondary | ICD-10-CM | POA: Insufficient documentation

## 2021-03-08 DIAGNOSIS — F17210 Nicotine dependence, cigarettes, uncomplicated: Secondary | ICD-10-CM | POA: Insufficient documentation

## 2021-03-08 DIAGNOSIS — W5501XA Bitten by cat, initial encounter: Secondary | ICD-10-CM | POA: Insufficient documentation

## 2021-03-08 DIAGNOSIS — J449 Chronic obstructive pulmonary disease, unspecified: Secondary | ICD-10-CM | POA: Insufficient documentation

## 2021-03-08 DIAGNOSIS — S61451D Open bite of right hand, subsequent encounter: Secondary | ICD-10-CM

## 2021-03-08 LAB — BASIC METABOLIC PANEL
Anion gap: 7 (ref 5–15)
BUN: 11 mg/dL (ref 8–23)
CO2: 27 mmol/L (ref 22–32)
Calcium: 9.1 mg/dL (ref 8.9–10.3)
Chloride: 104 mmol/L (ref 98–111)
Creatinine, Ser: 0.96 mg/dL (ref 0.61–1.24)
GFR, Estimated: >60 mL/min (ref >60)
Glucose, Bld: 85 mg/dL (ref 70–99)
Potassium: 4 mmol/L (ref 3.5–5.1)
Sodium: 138 mmol/L (ref 135–145)

## 2021-03-08 LAB — CBC WITH DIFFERENTIAL/PLATELET
Abs Immature Granulocytes: 0.02 10*3/uL (ref 0.00–0.07)
Basophils Absolute: 0.1 10*3/uL (ref 0.0–0.1)
Basophils Relative: 1 %
Eosinophils Absolute: 0.1 10*3/uL (ref 0.0–0.5)
Eosinophils Relative: 2 %
HCT: 42.5 % (ref 39.0–52.0)
Hemoglobin: 14.5 g/dL (ref 13.0–17.0)
Immature Granulocytes: 0 %
Lymphocytes Relative: 32 %
Lymphs Abs: 1.8 10*3/uL (ref 0.7–4.0)
MCH: 31.3 pg (ref 26.0–34.0)
MCHC: 34.1 g/dL (ref 30.0–36.0)
MCV: 91.8 fL (ref 80.0–100.0)
Monocytes Absolute: 0.4 10*3/uL (ref 0.1–1.0)
Monocytes Relative: 8 %
Neutro Abs: 3.2 10*3/uL (ref 1.7–7.7)
Neutrophils Relative %: 57 %
Platelets: 333 10*3/uL (ref 150–400)
RBC: 4.63 MIL/uL (ref 4.22–5.81)
RDW: 13.1 % (ref 11.5–15.5)
WBC: 5.6 10*3/uL (ref 4.0–10.5)
nRBC: 0 % (ref 0.0–0.2)

## 2021-03-08 MED ORDER — SULFAMETHOXAZOLE-TRIMETHOPRIM 800-160 MG PO TABS: 1.0000 | ORAL_TABLET | Freq: Two times a day (BID) | ORAL | 0 refills | Status: AC

## 2021-03-08 MED ORDER — SODIUM CHLORIDE 0.9 % IV SOLN: INTRAVENOUS | Status: DC | PRN

## 2021-03-08 MED ORDER — OXYCODONE HCL 5 MG PO TABS: 2.5000 mg | ORAL_TABLET | ORAL | 0 refills | Status: AC | PRN

## 2021-03-08 MED ORDER — SODIUM CHLORIDE 0.9 % IV SOLN
3.0000 g | Freq: Once | INTRAVENOUS | Status: AC
  Administered 2021-03-08: 3 g via INTRAVENOUS
  Filled 2021-03-08: qty 8

## 2021-03-08 MED ORDER — FENTANYL CITRATE PF 50 MCG/ML IJ SOSY
50.0000 ug | PREFILLED_SYRINGE | INTRAMUSCULAR | Status: AC | PRN
  Administered 2021-03-08 (×2): 50 ug via INTRAVENOUS
  Filled 2021-03-08 (×2): qty 1

## 2021-03-08 MED ORDER — ONDANSETRON HCL 4 MG/2ML IJ SOLN
4.0000 mg | Freq: Once | INTRAMUSCULAR | Status: AC
  Administered 2021-03-08: 4 mg via INTRAVENOUS
  Filled 2021-03-08: qty 2

## 2021-03-08 NOTE — ED Triage Notes (Signed)
Pt here for recheck; sts cat bite to RT thumb was packed on 11/18; sts taking abx as prescribed

## 2021-03-08 NOTE — Discharge Instructions (Addendum)
Follow-up on Monday or Tuesday with Dr. Yehuda Budd.  Continue to soak your thumb several times a day with water and iodine mix.  Return for any worsening symptoms.  Continue to take your antibiotics as directed
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
## 2021-03-09 LAB — AEROBIC CULTURE W GRAM STAIN (SUPERFICIAL SPECIMEN): Gram Stain: NONE SEEN

## 2022-07-12 IMAGING — DX DG FINGER THUMB 2+V*R*
3 series · 3 of 3 positions shown · non-contrast
Comparison: None.

CLINICAL DATA: Status post cat bite.

EXAM:
RIGHT THUMB 2+V

[finger ap]
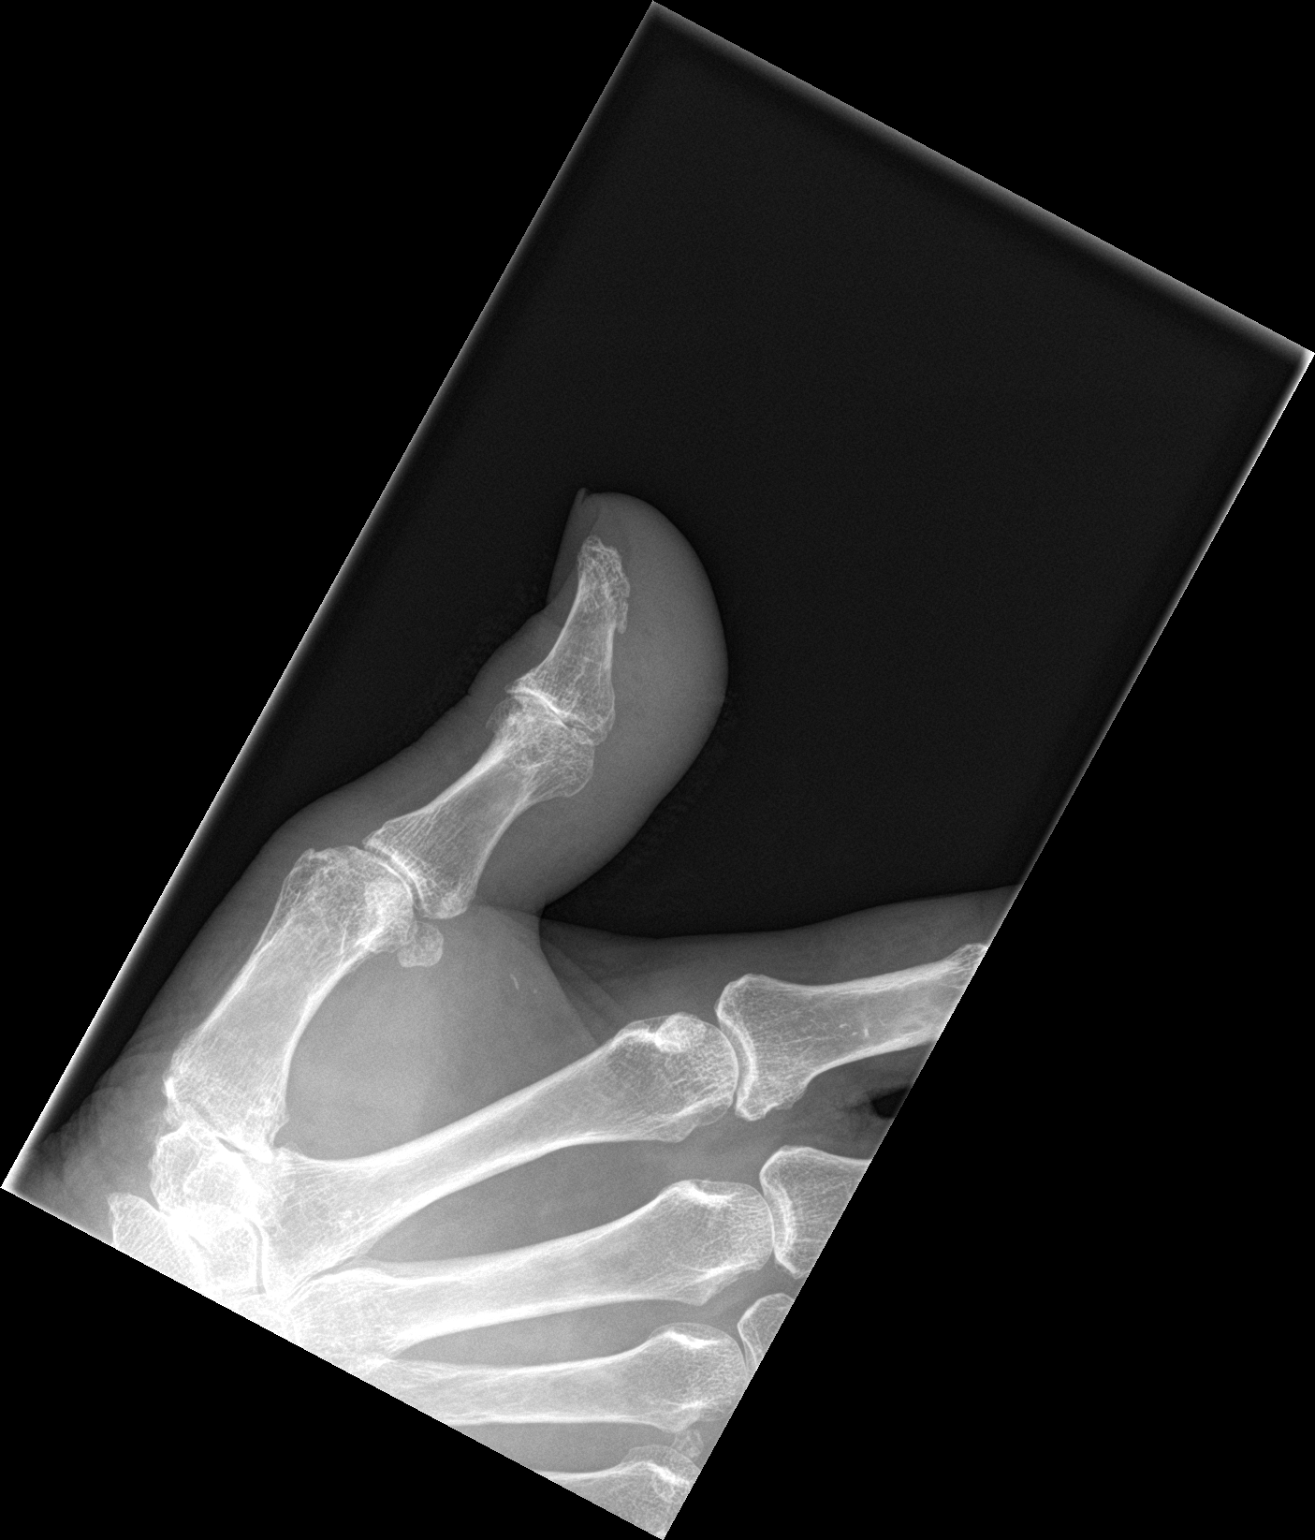

[finger obl]
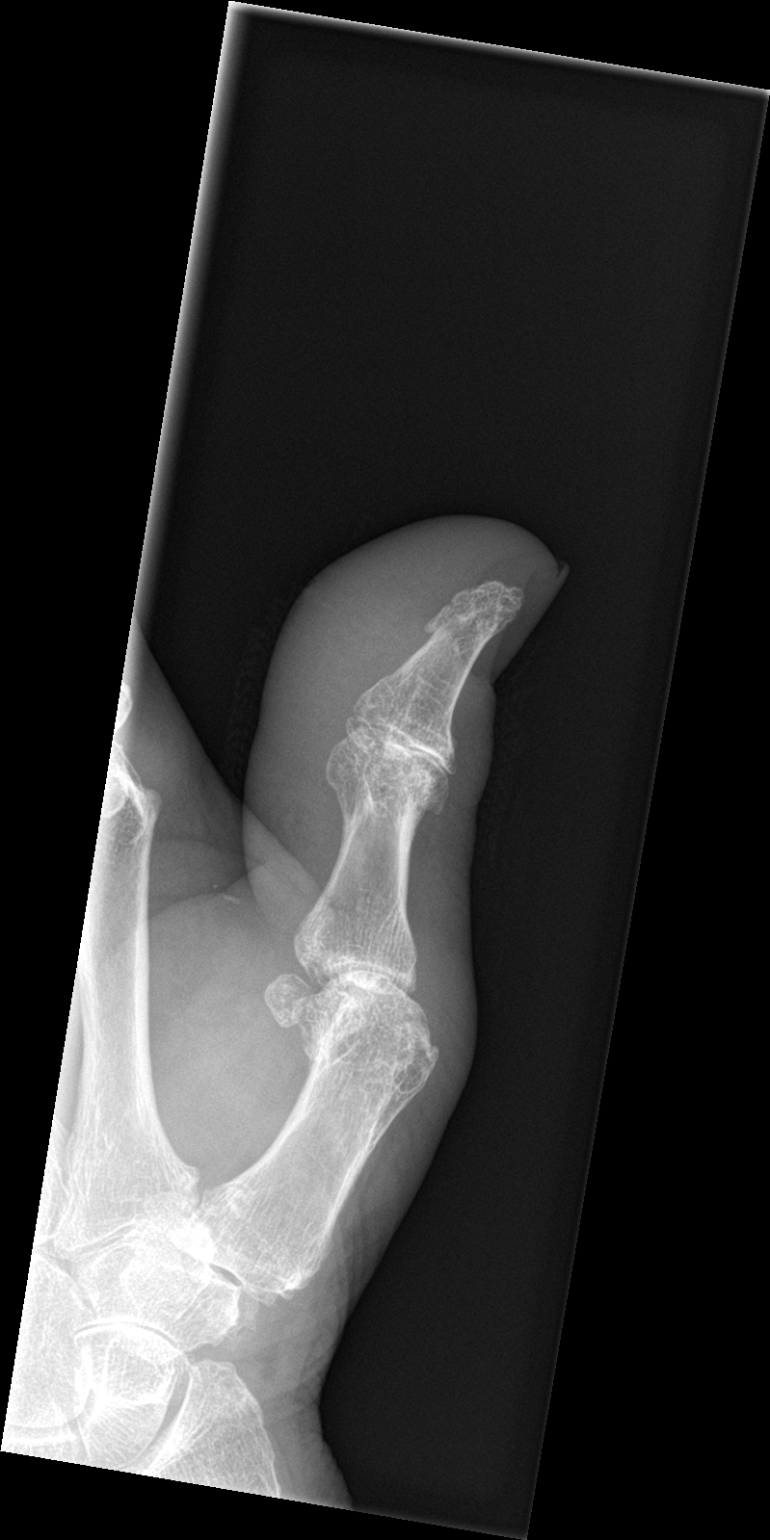

[finger lat]
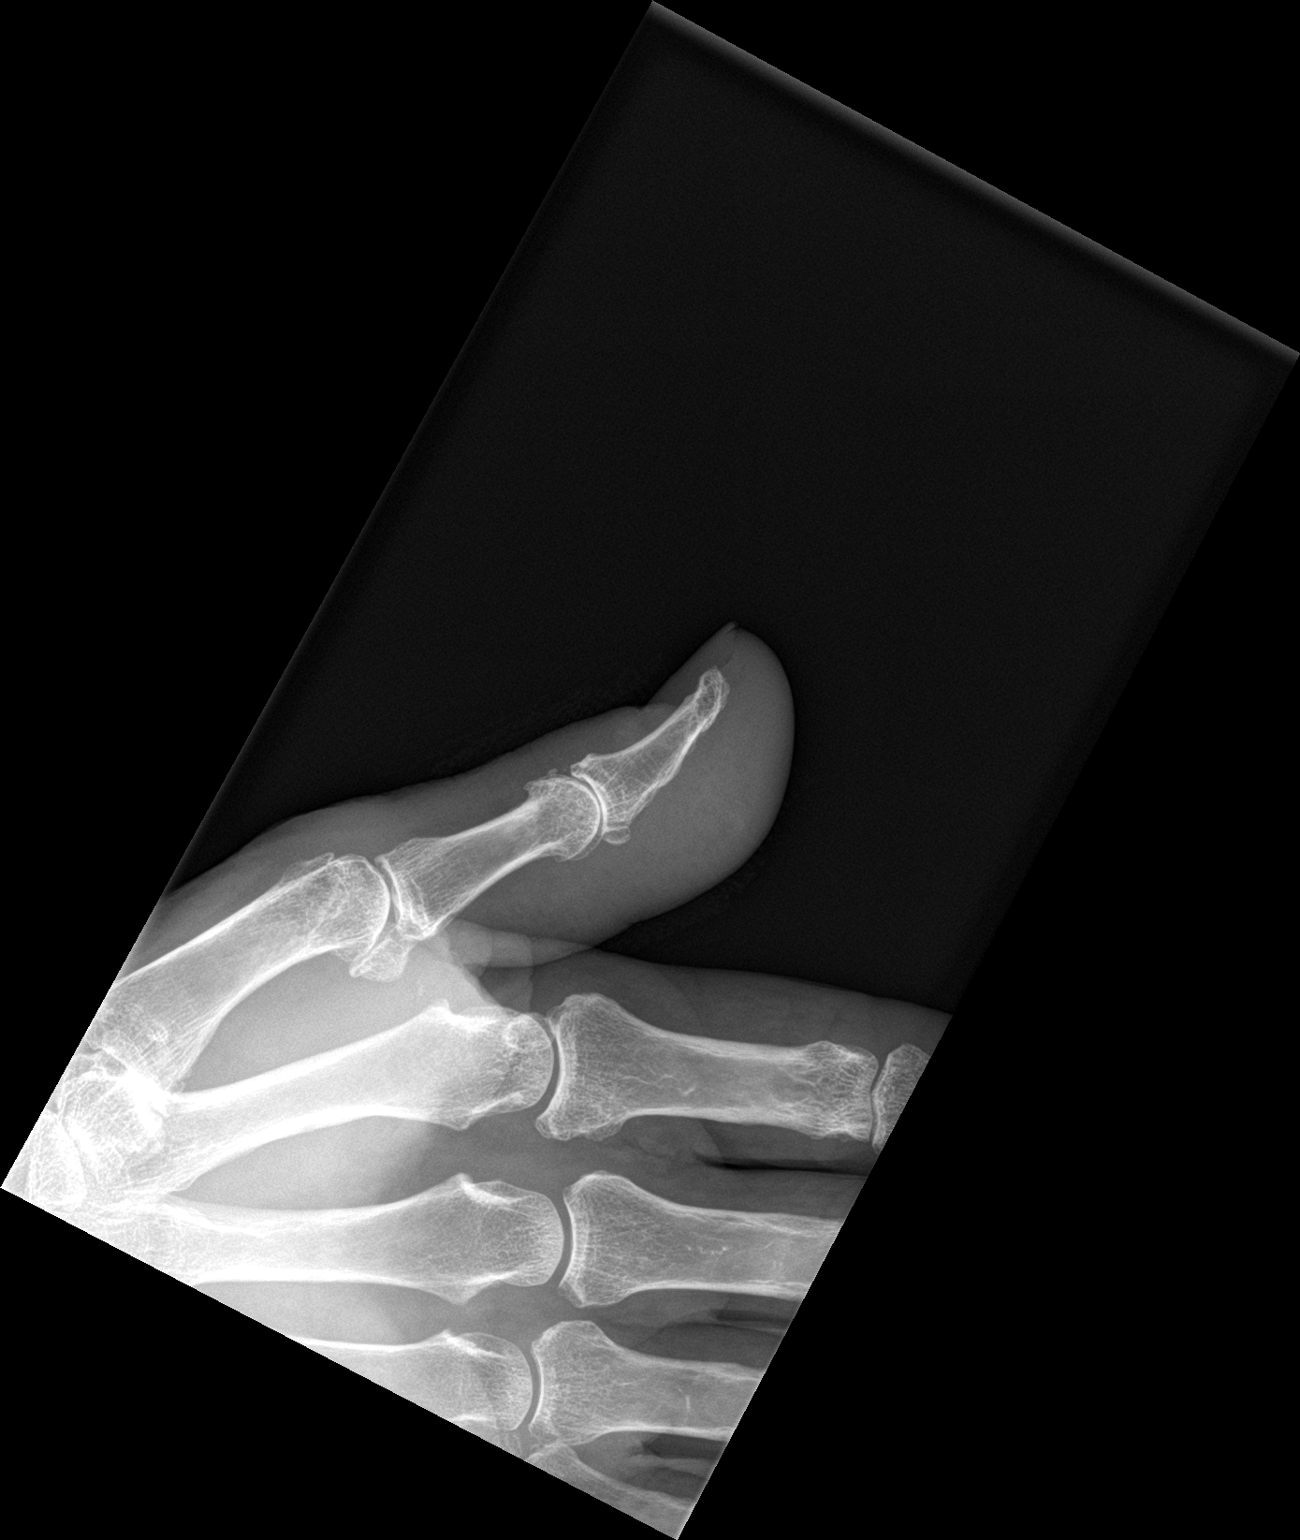

[3 of 3 positions shown; findings below may reference images not displayed]

FINDINGS: There is no evidence of fracture or dislocation. Degenerative
changes seen involving the metatarsophalangeal and interphalangeal
joints of the right thumb. Moderate to marked severity soft tissue
swelling is seen.
IMPRESSION: Moderate to marked severity soft tissue swelling without evidence of
an acute fracture.

## 2022-12-02 ENCOUNTER — Emergency Department (HOSPITAL_BASED_OUTPATIENT_CLINIC_OR_DEPARTMENT_OTHER)
Admission: EM | Admit: 2022-12-02 | Discharge: 2022-12-03 | Disposition: A | Discharge status: Home / Self Care | Payer: 59 | Attending: Emergency Medicine | Admitting: Emergency Medicine

## 2022-12-02 ENCOUNTER — Emergency Department (HOSPITAL_BASED_OUTPATIENT_CLINIC_OR_DEPARTMENT_OTHER): Payer: 59

## 2022-12-02 ENCOUNTER — Encounter (HOSPITAL_BASED_OUTPATIENT_CLINIC_OR_DEPARTMENT_OTHER): Payer: Self-pay

## 2022-12-02 ENCOUNTER — Other Ambulatory Visit: Payer: Self-pay

## 2022-12-02 DIAGNOSIS — R21 Rash and other nonspecific skin eruption: Secondary | ICD-10-CM | POA: Diagnosis not present

## 2022-12-02 DIAGNOSIS — R0602 Shortness of breath: Secondary | ICD-10-CM | POA: Diagnosis not present

## 2022-12-02 DIAGNOSIS — Z1152 Encounter for screening for COVID-19: Secondary | ICD-10-CM | POA: Insufficient documentation

## 2022-12-02 DIAGNOSIS — R052 Subacute cough: Secondary | ICD-10-CM | POA: Diagnosis not present

## 2022-12-02 DIAGNOSIS — Z7951 Long term (current) use of inhaled steroids: Secondary | ICD-10-CM | POA: Insufficient documentation

## 2022-12-02 DIAGNOSIS — J449 Chronic obstructive pulmonary disease, unspecified: Secondary | ICD-10-CM | POA: Diagnosis not present

## 2022-12-02 LAB — CBC WITH DIFFERENTIAL/PLATELET
Abs Immature Granulocytes: 0.02 10*3/uL (ref 0.00–0.07)
Basophils Absolute: 0.1 10*3/uL (ref 0.0–0.1)
Basophils Relative: 1 %
Eosinophils Absolute: 0.3 10*3/uL (ref 0.0–0.5)
Eosinophils Relative: 4 %
HCT: 43 % (ref 39.0–52.0)
Hemoglobin: 14.6 g/dL (ref 13.0–17.0)
Immature Granulocytes: 0 %
Lymphocytes Relative: 29 %
Lymphs Abs: 1.9 10*3/uL (ref 0.7–4.0)
MCH: 31.2 pg (ref 26.0–34.0)
MCHC: 34 g/dL (ref 30.0–36.0)
MCV: 91.9 fL (ref 80.0–100.0)
Monocytes Absolute: 0.7 10*3/uL (ref 0.1–1.0)
Monocytes Relative: 10 %
Neutro Abs: 3.6 10*3/uL (ref 1.7–7.7)
Neutrophils Relative %: 56 %
Platelets: 322 10*3/uL (ref 150–400)
RBC: 4.68 MIL/uL (ref 4.22–5.81)
RDW: 12.6 % (ref 11.5–15.5)
WBC: 6.5 10*3/uL (ref 4.0–10.5)
nRBC: 0 % (ref 0.0–0.2)

## 2022-12-02 LAB — COMPREHENSIVE METABOLIC PANEL
ALT: 10 U/L (ref 0–44)
AST: 16 U/L (ref 15–41)
Albumin: 3.8 g/dL (ref 3.5–5.0)
Alkaline Phosphatase: 67 U/L (ref 38–126)
Anion gap: 11 (ref 5–15)
BUN: 13 mg/dL (ref 8–23)
CO2: 22 mmol/L (ref 22–32)
Calcium: 8.8 mg/dL — ABNORMAL LOW (ref 8.9–10.3)
Chloride: 102 mmol/L (ref 98–111)
Creatinine, Ser: 0.89 mg/dL (ref 0.61–1.24)
GFR, Estimated: >60 mL/min (ref >60)
Glucose, Bld: 99 mg/dL (ref 70–99)
Potassium: 4.2 mmol/L (ref 3.5–5.1)
Sodium: 135 mmol/L (ref 135–145)
Total Bilirubin: 0.6 mg/dL (ref 0.3–1.2)
Total Protein: 6.7 g/dL (ref 6.5–8.1)

## 2022-12-02 LAB — RESP PANEL BY RT-PCR (RSV, FLU A&B, COVID)  RVPGX2
Influenza A by PCR: NEGATIVE
Influenza B by PCR: NEGATIVE
Resp Syncytial Virus by PCR: NEGATIVE
SARS Coronavirus 2 by RT PCR: NEGATIVE

## 2022-12-02 MED ORDER — IOHEXOL 300 MG/ML  SOLN
75.0000 mL | Freq: Once | INTRAMUSCULAR | Status: AC | PRN
  Administered 2022-12-02: 75 mL via INTRAVENOUS

## 2022-12-02 MED ORDER — ALBUTEROL SULFATE HFA 108 (90 BASE) MCG/ACT IN AERS: 1.0000 | INHALATION_SPRAY | RESPIRATORY_TRACT | 0 refills | Status: AC | PRN

## 2022-12-02 MED ORDER — AZITHROMYCIN 250 MG PO TABS: 250.0000 mg | ORAL_TABLET | Freq: Every day | ORAL | 0 refills | Status: AC

## 2022-12-02 MED ORDER — IPRATROPIUM-ALBUTEROL 0.5-2.5 (3) MG/3ML IN SOLN
3.0000 mL | Freq: Once | RESPIRATORY_TRACT | Status: AC
  Administered 2022-12-02: 3 mL via RESPIRATORY_TRACT
  Filled 2022-12-02: qty 3

## 2022-12-02 NOTE — Progress Notes (Signed)
RN gave Duoneb treatment.

## 2022-12-02 NOTE — ED Provider Notes (Signed)
[place] [facility] AT [facility] Provider Note   CSN: [id number] Arrival date & time: 12/02/22  1840     History  Chief Complaint  Patient presents with   Shortness of Breath    Joseph Berry is a 82 y.o. male with history of COPD, stroke, who presents to the emergency department complaining of shortness of breath for the past month.  Patient states that he does have oxygen at home, but only uses it as needed.  He has not required it more recently, but has been using his inhalers more.  He has a nonproductive cough.  He has been more fatigued as well, and has some left-sided chest pain with coughing.  He states that his home has had several plumbing issues, and is currently infested with cockroaches.  They have been using multiple pesticides.  He is concerned he picked up some kind of infection from the insects.  He feels he had a fever this morning, but did not check his temperature.  Also intermittently having diarrhea.   Shortness of Breath Associated symptoms: chest pain, cough and rash        Home Medications Prior to Admission medications   Medication Sig Start Date End Date Taking? Authorizing Provider  azithromycin (ZITHROMAX) 250 MG tablet Take 1 tablet (250 mg total) by mouth daily. Take first 2 tablets together, then 1 every day until finished. 12/02/22  Yes Pepe Mineau T, PA-C  albuterol (VENTOLIN HFA) 108 (90 Base) MCG/ACT inhaler Inhale 1-2 puffs into the lungs every 4 (four) hours as needed for wheezing or shortness of breath. 12/02/22   Daymein Nunnery T, PA-C  amoxicillin-clavulanate (AUGMENTIN) 875-125 MG tablet Take 1 tablet by mouth 2 (two) times daily. One po bid x 7 days 03/06/21   Charlynne Pander, MD  aspirin EC 325 MG tablet Take 1 tablet (325 mg total) by mouth 2 (two) times daily. 11/22/16   Allena Katz, PA-C  gabapentin (NEURONTIN) 400 MG capsule Take 400 mg by mouth 3 (three) times daily.    [provider]   HYDROcodone-acetaminophen (NORCO/VICODIN) 5-325 MG tablet Take 1 tablet by mouth every 6 (six) hours as needed. 03/06/21   Charlynne Pander, MD  nabumetone (RELAFEN) 500 MG tablet Take 500 mg by mouth daily.    [provider]  oxyCODONE-acetaminophen (ROXICET) 5-325 MG tablet Take 1 tablet by mouth every 4 (four) hours as needed. 11/22/16   Allena Katz, PA-C  predniSONE (STERAPRED UNI-PAK 21 TAB) 10 MG (21) TBPK tablet Take 6 tabs for 2 days, then 5 for 2 days, then 4 for 2 days, then 3 for 2 days, 2 for 2 days, then 1 for 2 days 06/30/19   Jacalyn Lefevre, MD  tiZANidine (ZANAFLEX) 2 MG tablet Take 1 tablet (2 mg total) by mouth every 6 (six) hours as needed for muscle spasms. 11/22/16   Allena Katz, PA-C  traMADol (ULTRAM) 50 MG tablet Take 1 tablet (50 mg total) by mouth every 6 (six) hours as needed. 01/13/19   Geoffery Lyons, MD  valACYclovir (VALTREX) 1000 MG tablet Take 1 tablet (1,000 mg total) by mouth 3 (three) times daily. 01/13/19   Geoffery Lyons, MD      Allergies    Atorvastatin    Review of Systems   Review of Systems  Constitutional:  Positive for fatigue.  Respiratory:  Positive for cough and shortness of breath.   Cardiovascular:  Positive for chest pain.  Gastrointestinal:  Positive  for diarrhea.  Skin:  Positive for rash.  All other systems reviewed and are negative.   Physical Exam Updated Vital Signs BP 125/79   Pulse 67   Temp 98 F (36.7 C) (Oral)   Resp 13   Ht 5\' 11"  (1.803 m)   Wt 69.4 kg   SpO2 96%   BMI 21.34 kg/m  Physical Exam Vitals and nursing note reviewed.  Constitutional:      Appearance: Normal appearance.  HENT:     Head: Normocephalic and atraumatic.  Eyes:     Conjunctiva/sclera: Conjunctivae normal.  Cardiovascular:     Rate and Rhythm: Normal rate and regular rhythm.  Pulmonary:     Effort: Pulmonary effort is normal. No respiratory distress.     Breath sounds: Normal breath sounds.  Chest:     Comments: Left  anterior chest wall tenderness to palpation without obvious deformity Abdominal:     General: There is no distension.     Palpations: Abdomen is soft.     Tenderness: There is no abdominal tenderness.  Skin:    General: Skin is warm and dry.     Comments: Diffuse insect bites to the extremities, not cellulitic, not fluctuant  Neurological:     General: No focal deficit present.     Mental Status: He is alert.     ED Results / Procedures / Treatments   Labs (all labs ordered are listed, but only abnormal results are displayed) Labs Reviewed  COMPREHENSIVE METABOLIC PANEL - Abnormal; Notable for the following components:      Result Value   Calcium 8.8 (*)    All other components within normal limits  RESP PANEL BY RT-PCR (RSV, FLU A&B, COVID)  [id number]  CBC WITH DIFFERENTIAL/PLATELET    EKG None  Radiology DG Chest 2 View  Result Date: 12/02/2022 CLINICAL DATA:  Shortness of breath fatigue EXAM: CHEST - 2 VIEW COMPARISON:  08/17/2020 FINDINGS: Mild bronchitic changes. Calcified left lung base nodule. No acute airspace disease, pleural effusion, or pneumothorax. Cardiomediastinal silhouette within normal limits IMPRESSION: Hyperinflation with bronchitic changes. Electronically Signed   By: Jasmine Pang M.D.   On: 12/02/2022 20:39    Procedures Procedures    Medications Ordered in ED Medications  ipratropium-albuterol (DUONEB) 0.5-2.5 (3) MG/3ML nebulizer solution 3 mL (3 mLs Nebulization Given 12/02/22 2041)  iohexol (OMNIPAQUE) 300 MG/ML solution 75 mL (75 mLs Intravenous Contrast Given 12/02/22 2241)    ED Course/ Medical Decision Making/ A&P                                 Medical Decision Making Amount and/or Complexity of Data Reviewed Labs: ordered. Radiology: ordered.  Risk Prescription drug management.   This patient is a 82 y.o. male  who presents to the ED for concern of shortness of breath x 1 month. Also diarrhea, fatigue, rash.   Differential  diagnoses prior to evaluation: The emergent differential diagnosis includes, but is not limited to,  CHF, pericardial effusion/tamponade, arrhythmias, ACS, COPD, asthma, bronchitis, pneumonia, pneumothorax, PE, anemia. This is not an exhaustive differential.   Past Medical History / Co-morbidities / Social History: COPD, stroke  Physical Exam: Physical exam performed. The pertinent findings include: Normal vital signs, no acute distress.  Left anterior chest wall tenderness to palpation, without deformity.  Lung sounds clear, normal respiratory effort.  Maintaining normal oxygen saturation on room air.  Lab Tests/Imaging studies: I personally interpreted  labs/imaging and the pertinent results include: CBC and CMP unremarkable.  Negative respiratory panel..  Chest x-ray with hyperinflation of bronchitis changes, had concern for possible underlying pneumonia or rib fracture with chest wall pain and ongoing cough, so obtain chest CT.  CT shows trace left-sided pleural effusion, emphysema, no acute disease.  I agree with the radiologist interpretation.  Cardiac monitoring: EKG obtained and interpreted by myself and attending physician which shows: Normal sinus rhythm, no acute ischemic changes   Medications: I ordered medication including nebulizer breathing treatment.  I have reviewed the patients home medicines and have made adjustments as needed.  Upon reevaluation patient states that shortness of breath has improved.   Disposition: After consideration of the diagnostic results and the patients response to treatment, I feel that emergency department workup does not suggest an emergent condition requiring admission or immediate intervention beyond what has been performed at this time. The plan is: Discharged to home with symptomatic management of subacute cough.  Suspect component of viral infection and bronchitis, however patient has had increased risk due to age and history of COPD, so will  prescribe some azithromycin as well.  Encouraged him to follow-up with his primary doctor regarding his ongoing symptoms.  I suspect some is in relation to the environmental exposures in his house, but no emergent findings requiring hospitalization today.. The patient is safe for discharge and has been instructed to return immediately for worsening symptoms, change in symptoms or any other concerns.  Final Clinical Impression(s) / ED Diagnoses Final diagnoses:  Subacute cough    Rx / DC Orders ED Discharge Orders          Ordered    azithromycin (ZITHROMAX) 250 MG tablet  Daily        12/02/22 2350    albuterol (VENTOLIN HFA) 108 (90 Base) MCG/ACT inhaler  Every 4 hours PRN        12/02/22 2350           Portions of this report may have been transcribed using voice recognition software. Every effort was made to ensure accuracy; however, inadvertent computerized transcription errors may be present.    Jeanella Flattery 12/02/22 2352    Vanetta Mulders, MD 12/05/22 1732

## 2022-12-02 NOTE — Discharge Instructions (Addendum)
You were seen in the emergency department today for shortness of breath and cough.  As we discussed your blood work looked very reassuring.  Your CT scan did not show any pneumonia or broken ribs.  I am writing you a prescription for some antibiotics to prevent you from developing infection with your COPD.  I have also refilled your inhaler.  Please follow-up with your primary doctor regarding your visit today.  Continue to monitor how you are doing and return to the ER for any new or worsening symptoms.

## 2022-12-02 NOTE — ED Triage Notes (Addendum)
Pt has hx of COPD and states he has been more [facility] for the last month.  Has oxygen at home and uses it as needed. States he is fatigued. States that his home has been infested with roaches and has been using pesticides
